# Patient Record
Sex: Female | Born: 1990 | Hispanic: No | Marital: Single | Smoking: Former smoker
Health system: Southern US, Community
[De-identification: ages and names within clinical notes are randomized; demographics above are authoritative.]

## PROBLEM LIST (undated history)

## (undated) DIAGNOSIS — F209 Schizophrenia, unspecified: Secondary | ICD-10-CM

## (undated) DIAGNOSIS — J45909 Unspecified asthma, uncomplicated: Secondary | ICD-10-CM

## (undated) DIAGNOSIS — F319 Bipolar disorder, unspecified: Secondary | ICD-10-CM

---

## 2012-12-04 ENCOUNTER — Encounter (HOSPITAL_COMMUNITY): Payer: Self-pay | Admitting: Emergency Medicine

## 2012-12-04 ENCOUNTER — Emergency Department (HOSPITAL_COMMUNITY)
Admission: EM | Admit: 2012-12-04 | Discharge: 2012-12-04 | Disposition: A | Discharge status: Home / Self Care | Payer: Medicaid Other | Source: Home / Self Care | Attending: Emergency Medicine | Admitting: Emergency Medicine

## 2012-12-04 DIAGNOSIS — H9190 Unspecified hearing loss, unspecified ear: Secondary | ICD-10-CM

## 2012-12-04 DIAGNOSIS — H9192 Unspecified hearing loss, left ear: Secondary | ICD-10-CM

## 2012-12-04 DIAGNOSIS — N739 Female pelvic inflammatory disease, unspecified: Secondary | ICD-10-CM

## 2012-12-04 HISTORY — DX: Bipolar disorder, unspecified: F31.9

## 2012-12-04 HISTORY — DX: Schizophrenia, unspecified: F20.9

## 2012-12-04 MED ORDER — DOXYCYCLINE HYCLATE 100 MG PO CAPS: 100.0000 mg | ORAL_CAPSULE | Freq: Two times a day (BID) | ORAL | Status: AC

## 2012-12-04 NOTE — ED Notes (Signed)
Multiple complaints: ear pain, left ear.  Minimal runny nose, no sore throat.  Hurting for 3-4 days.   Abscess on labia -noticed 2-3 days ago.

## 2012-12-04 NOTE — ED Provider Notes (Signed)
History     CSN: [id number]  Arrival date & time 12/04/12  1052   First MD Initiated Contact with Patient 12/04/12 1104      Chief Complaint  Patient presents with  . Otalgia    (Consider location/radiation/quality/duration/timing/severity/associated sxs/prior treatment) HPI Comments: Patient presents urgent care, with 2 distinctive concerns. She feels that she's not hearing well from her left ear for several days. Patient denies any ear drainage, pain, recent cold-like symptoms such as a runny nose, nasal congestion, cough or fevers. She denies any recent ear trauma.   Patient also describes a for the last several days she's been having infection on her external genitalia on the left side. His been draining and she busted a boil, she has had the same infection before. On several locations. Patient denies any fevers, pelvic pain, vaginal discharge, or vaginal bleeding.  Patient is a 22 y.o. female presenting with ear pain. The history is provided by the patient.  Otalgia Location:  Left Severity:  No pain Onset quality:  Gradual Timing:  Intermittent Progression:  Waxing and waning Context: not direct blow, not elevation change, not loud noise and no water in ear   Relieved by:  Nothing Ineffective treatments:  None tried Associated symptoms: hearing loss   Associated symptoms: no congestion, no cough, no neck pain, no rhinorrhea, no sore throat and no tinnitus   Risk factors: no recent travel, no chronic ear infection and no prior ear surgery     Past Medical History  Diagnosis Date  . Schizophrenia   . Bipolar 1 disorder     History reviewed. No pertinent past surgical history.  No family history on file.  History  Substance Use Topics  . Smoking status: Current Every Day Smoker  . Smokeless tobacco: Not on file  . Alcohol Use: No    OB History   Grav Para Term Preterm Abortions TAB SAB Ect Mult Living                  Review of Systems  Constitutional:  Negative for chills, diaphoresis, activity change and appetite change.  HENT: Positive for hearing loss. Negative for ear pain, nosebleeds, congestion, sore throat, facial swelling, rhinorrhea, sneezing, mouth sores, neck pain, neck stiffness, postnasal drip and tinnitus.   Respiratory: Negative for cough.   Cardiovascular: Negative for chest pain.  Genitourinary: Positive for genital sores. Negative for dysuria, urgency, frequency, flank pain, decreased urine volume, vaginal bleeding, vaginal discharge, difficulty urinating and pelvic pain.    Allergies  Review of patient's allergies indicates no known allergies.  Home Medications   Current Outpatient Rx  Name  Route  Sig  Dispense  Refill  . ARIPiprazole (ABILIFY) 5 MG tablet   Oral   Take 5 mg by mouth daily.         . benztropine (COGENTIN) 0.5 MG tablet   Oral   Take 0.5 mg by mouth 2 (two) times daily.         . folic acid (FOLVITE) 0.5 MG tablet   Oral   Take 1 mg by mouth daily.         . risperiDONE (RISPERDAL) 2 MG tablet   Oral   Take 2 mg by mouth 2 (two) times daily.         Marland Kitchen doxycycline (VIBRAMYCIN) 100 MG capsule   Oral   Take 1 capsule (100 mg total) by mouth 2 (two) times daily.   20 capsule   0  BP 95/64  Pulse 80  Temp(Src) 98.4 F (36.9 C) (Oral)  Resp 24  SpO2 97%  LMP 10/28/2012  Physical Exam  Vitals reviewed. Constitutional: She is oriented to person, place, and time. She appears well-developed and well-nourished.  HENT:  Head: Normocephalic and atraumatic.  Right Ear: External ear normal.  Left Ear: External ear normal.  Mouth/Throat: Oropharynx is clear and moist. No oropharyngeal exudate.  Eyes: Conjunctivae are normal. Pupils are equal, round, and reactive to light.  Neck: Normal range of motion. Neck supple. No JVD present.  Genitourinary:    No labial fusion. There is no rash, tenderness, lesion or injury on the right labia. There is tenderness on the left labia.  There is no rash or injury on the left labia. No erythema or bleeding around the vagina. No signs of injury around the vagina. No vaginal discharge found.  Lymphadenopathy:    She has no cervical adenopathy.  Neurological: She is alert and oriented to person, place, and time.  Skin: No rash noted. There is erythema. No pallor.    ED Course  Procedures (including critical care time)  Labs Reviewed  CULTURE, ROUTINE-ABSCESS   No results found.   1. Abscess of female genitalia   2. Hearing deficit, left       MDM  Problem #1 hearing deficit concern have refer patient to followup with an audiologist for a hearing test. Ear exam was unremarkable no signs of any external ear infection or tympanic membrane infection.  Problem #2 patient with a left labia majora localized abscess- no indication for incision and drainage this abscess was draining on its own. We obtained a sample for cultures and start patient on doxycycline.      Jimmie Molly, MD 12/04/12 [phone]

## 2012-12-07 LAB — CULTURE, ROUTINE-ABSCESS

## 2013-04-19 ENCOUNTER — Emergency Department (HOSPITAL_BASED_OUTPATIENT_CLINIC_OR_DEPARTMENT_OTHER): Payer: Medicaid Other

## 2013-04-19 ENCOUNTER — Emergency Department (HOSPITAL_BASED_OUTPATIENT_CLINIC_OR_DEPARTMENT_OTHER)
Admission: EM | Admit: 2013-04-19 | Discharge: 2013-04-19 | Disposition: A | Discharge status: Home / Self Care | Payer: Medicaid Other | Attending: Emergency Medicine | Admitting: Emergency Medicine

## 2013-04-19 ENCOUNTER — Encounter (HOSPITAL_BASED_OUTPATIENT_CLINIC_OR_DEPARTMENT_OTHER): Payer: Self-pay | Admitting: *Deleted

## 2013-04-19 DIAGNOSIS — F209 Schizophrenia, unspecified: Secondary | ICD-10-CM | POA: Insufficient documentation

## 2013-04-19 DIAGNOSIS — X500XXA Overexertion from strenuous movement or load, initial encounter: Secondary | ICD-10-CM | POA: Insufficient documentation

## 2013-04-19 DIAGNOSIS — S93409A Sprain of unspecified ligament of unspecified ankle, initial encounter: Secondary | ICD-10-CM | POA: Insufficient documentation

## 2013-04-19 DIAGNOSIS — Y9367 Activity, basketball: Secondary | ICD-10-CM | POA: Insufficient documentation

## 2013-04-19 DIAGNOSIS — Y9239 Other specified sports and athletic area as the place of occurrence of the external cause: Secondary | ICD-10-CM | POA: Insufficient documentation

## 2013-04-19 DIAGNOSIS — F319 Bipolar disorder, unspecified: Secondary | ICD-10-CM | POA: Insufficient documentation

## 2013-04-19 DIAGNOSIS — S93401A Sprain of unspecified ligament of right ankle, initial encounter: Secondary | ICD-10-CM

## 2013-04-19 DIAGNOSIS — Y92838 Other recreation area as the place of occurrence of the external cause: Secondary | ICD-10-CM | POA: Insufficient documentation

## 2013-04-19 DIAGNOSIS — F17200 Nicotine dependence, unspecified, uncomplicated: Secondary | ICD-10-CM | POA: Insufficient documentation

## 2013-04-19 IMAGING — CR DG ANKLE COMPLETE 3+V*R*
3 series · 3 of 3 positions shown · non-contrast
Comparison: None.

CLINICAL DATA: Lateral ankle pain secondary to a twisting injury
today.

RIGHT ANKLE - COMPLETE 3+ VIEW

[t ankle joint ap right]
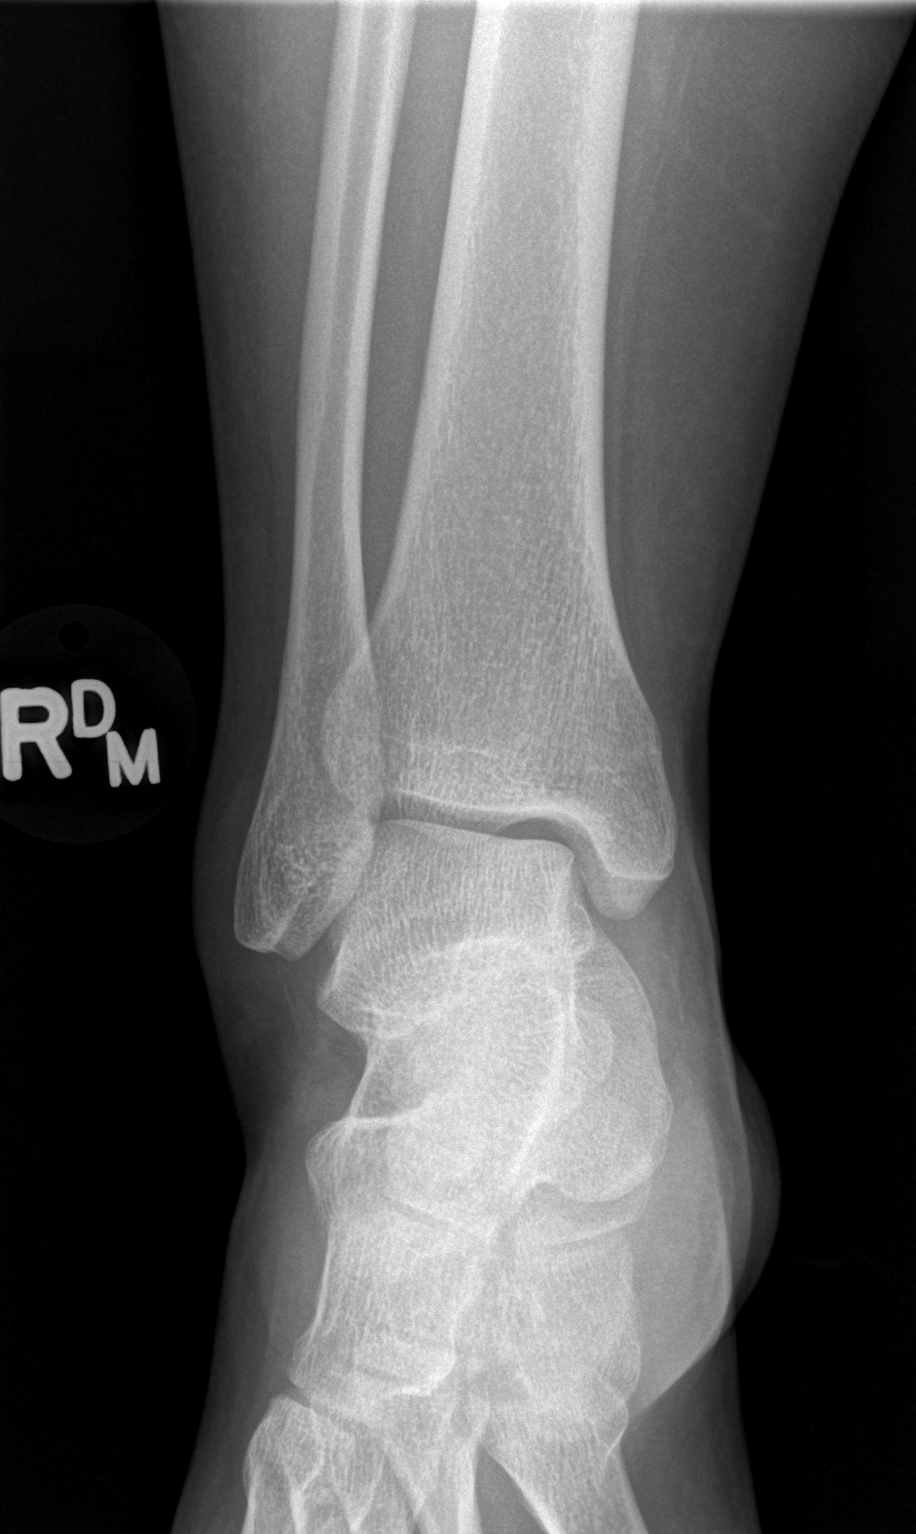

[t ankle joint oblique right]
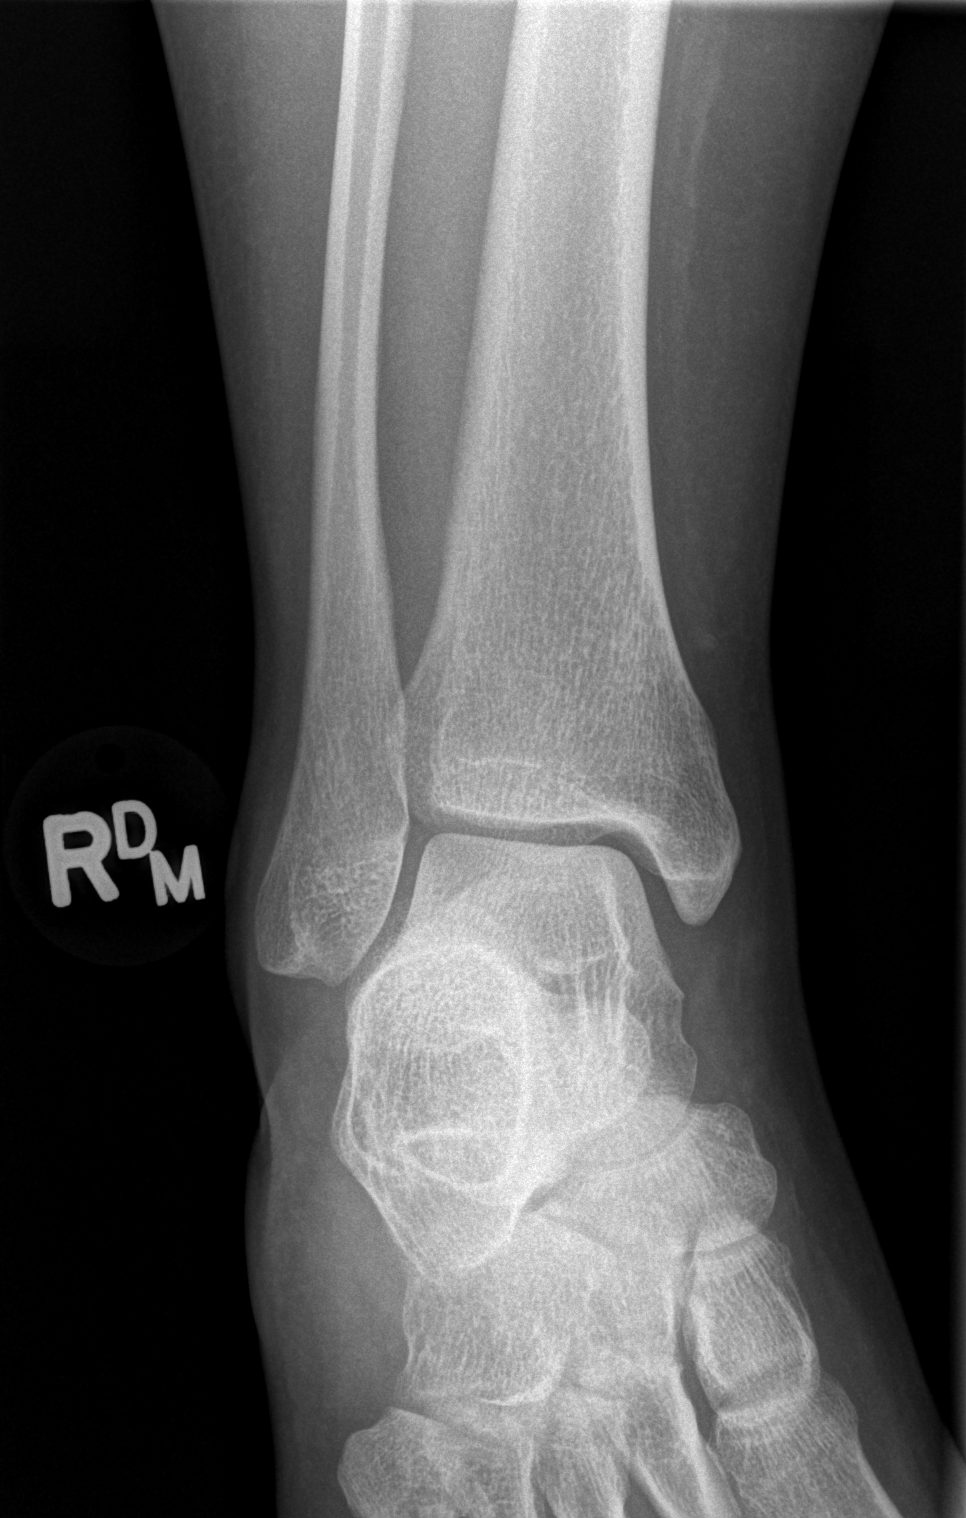

[t ankle joint lat right]
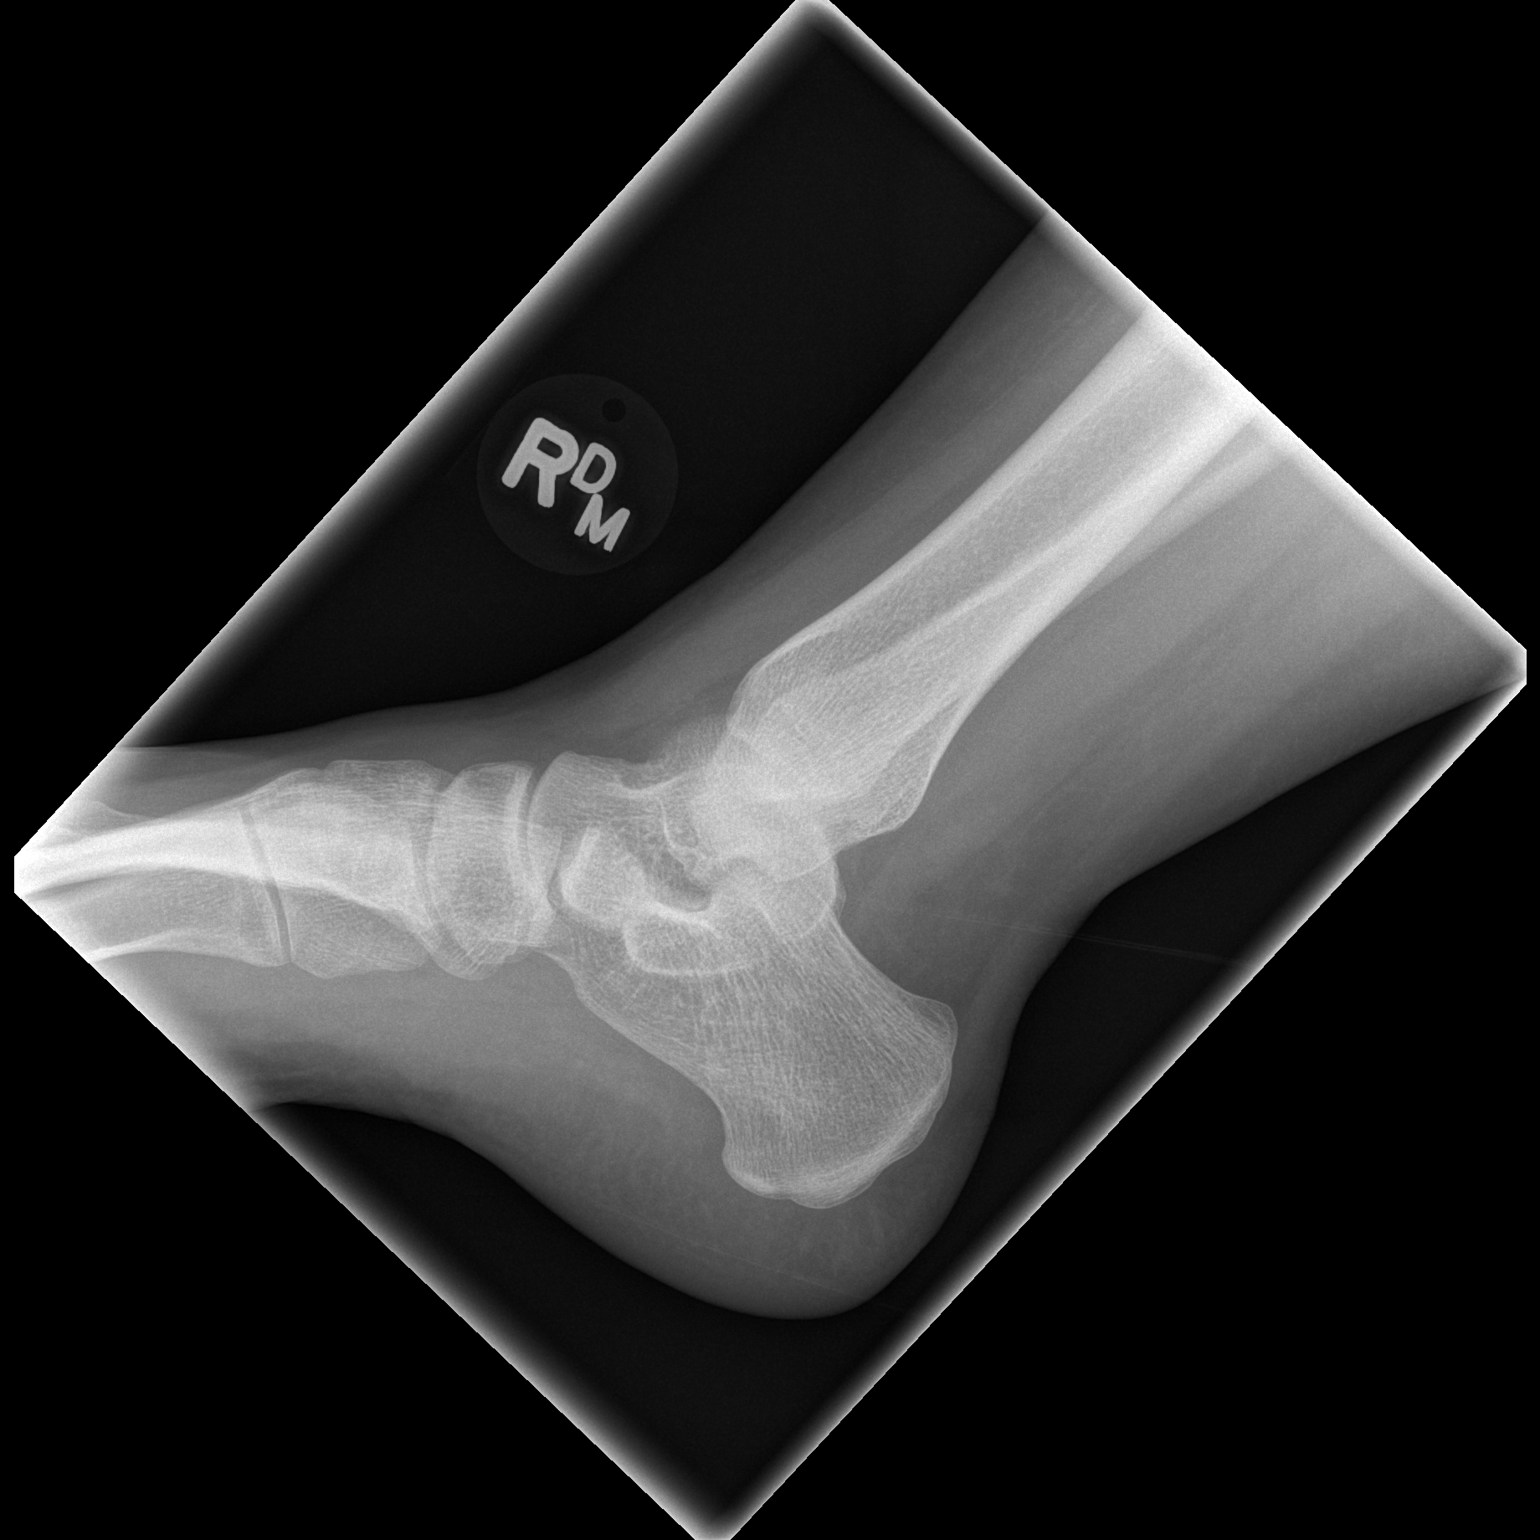

[3 of 3 positions shown; findings below may reference images not displayed]

FINDINGS: There is no fracture, dislocation, or joint effusion.
IMPRESSION: Normal exam.

## 2013-04-19 MED ORDER — HYDROCODONE-ACETAMINOPHEN 5-325 MG PO TABS: 1.0000 | ORAL_TABLET | Freq: Four times a day (QID) | ORAL | Status: DC | PRN

## 2013-04-19 NOTE — ED Notes (Signed)
Pt states she injured her right ankle playing bball today. PMS intact. Ice applied at triage.

## 2013-04-19 NOTE — ED Notes (Signed)
I placed an ASO on the patient's right ankle. There isn't placed in the charge capture under the supplies to charge for the ASO.

## 2013-04-19 NOTE — ED Provider Notes (Signed)
History    CSN: [id number] Arrival date & time 04/19/13  1255  First MD Initiated Contact with Patient 04/19/13 1326     Chief Complaint  Patient presents with  . Ankle Injury   (Consider location/radiation/quality/duration/timing/severity/associated sxs/prior Treatment) Patient is a 22 y.o. female presenting with ankle pain. The history is provided by the patient.  Ankle Pain Location:  Ankle Time since incident:  1 hour Injury: yes   Mechanism of injury comment:  Basketball Ankle location:  R ankle Pain details:    Quality:  Aching   Radiates to:  Does not radiate   Severity:  Moderate   Onset quality:  Sudden   Timing:  Constant   Progression:  Improving Chronicity:  New Dislocation: no   Foreign body present:  No foreign bodies Prior injury to area:  No Relieved by:  None tried Worsened by:  Bearing weight Ineffective treatments:  None tried Associated symptoms: swelling   Associated symptoms: no decreased ROM, no fever, no muscle weakness, no neck pain, no numbness, no stiffness and no tingling   Risk factors: obesity   Risk factors: no concern for non-accidental trauma, no frequent fractures, no known bone disorder and no recent illness    Lindsay Perry is a 22 y.o. female who presents to the ED with right ankle pain. The pain started suddenly approximately 15 minutes prior to arrival to the ED. She was playing basketball and rolled her ankle inward. She complained of pain and swelling immediately after the injury.  Ice applied at triage. She denies any other injuries.   Past Medical History  Diagnosis Date  . Schizophrenia   . Bipolar 1 disorder    History reviewed. No pertinent past surgical history. History reviewed. No pertinent family history. History  Substance Use Topics  . Smoking status: Current Every Day Smoker  . Smokeless tobacco: Not on file  . Alcohol Use: No   OB History   Grav Para Term Preterm Abortions TAB SAB Ect Mult Living        Review of Systems  Constitutional: Negative for fever.  HENT: Negative for neck pain.   Respiratory: Negative for shortness of breath.   Cardiovascular: Negative for chest pain.  Musculoskeletal: Negative for stiffness.       Right ankle pain  Skin: Negative for wound.  Neurological: Negative for dizziness and headaches.  Psychiatric/Behavioral: The patient is not nervous/anxious.     Allergies  Review of patient's allergies indicates no known allergies.  Home Medications   Current Outpatient Rx  Name  Route  Sig  Dispense  Refill  . ARIPiprazole (ABILIFY) 5 MG tablet   Oral   Take 5 mg by mouth daily.         . benztropine (COGENTIN) 0.5 MG tablet   Oral   Take 0.5 mg by mouth 2 (two) times daily.         . folic acid (FOLVITE) 0.5 MG tablet   Oral   Take 1 mg by mouth daily.         . risperiDONE (RISPERDAL) 2 MG tablet   Oral   Take 2 mg by mouth 2 (two) times daily.          BP 101/60  Pulse 90  Temp(Src) 98.3 F (36.8 C) (Oral)  Resp 18  Ht 5\' 3"  (1.6 m)  Wt 210 lb (95.255 kg)  BMI 37.21 kg/m2  SpO2 97%  LMP 03/25/2013 Physical Exam  Nursing note and vitals reviewed. Constitutional:  She is oriented to person, place, and time. She appears well-developed and well-nourished. No distress.  HENT:  Head: Normocephalic.  Eyes: EOM are normal.  Neck: Neck supple.  Cardiovascular: Normal rate.   Pulmonary/Chest: Effort normal.  Abdominal: Soft. There is no tenderness.  Musculoskeletal:       Right ankle: She exhibits swelling (lateral aspect). She exhibits normal range of motion, no deformity, no laceration and normal pulse. Tenderness. Lateral malleolus tenderness found. Achilles tendon normal.  Neurological: She is alert and oriented to person, place, and time. No cranial nerve deficit.  Skin: Skin is warm and dry.  Psychiatric: She has a normal mood and affect. Her behavior is normal.   Dg Ankle Complete Right  04/19/2013   *RADIOLOGY  REPORT*  Clinical Data: Lateral ankle pain secondary to a twisting injury today.  RIGHT ANKLE - COMPLETE 3+ VIEW  Comparison: None.  Findings: There is no fracture, dislocation, or joint effusion.  IMPRESSION: Normal exam.   Original Report Authenticated By: Francene Boyers, M.D.    ED Course  Procedures  MDM  22 y.o. female with right ankle sprain. Will apply ASO and she will use crutches for ambulation. She will apply ice and we will treat pain. She will take ibuprofen in addition to the pain medication. Patient stable for discharge at this time without immediate concern for compartment syndrome.   I have reviewed this patient's vital signs, nurses notes, appropriate imaging and discussed findings and plan of care with the patient. She will wear the ASO as needed for comfort.    Medication List    TAKE these medications       HYDROcodone-acetaminophen 5-325 MG per tablet  Commonly known as:  NORCO/VICODIN  Take 1 tablet by mouth every 6 (six) hours as needed for pain.      ASK your doctor about these medications       ARIPiprazole 5 MG tablet  Commonly known as:  ABILIFY  Take 5 mg by mouth daily.     benztropine 0.5 MG tablet  Commonly known as:  COGENTIN  Take 0.5 mg by mouth 2 (two) times daily.     folic acid 0.5 MG tablet  Commonly known as:  FOLVITE  Take 1 mg by mouth daily.     risperiDONE 2 MG tablet  Commonly known as:  RISPERDAL  Take 2 mg by mouth 2 (two) times daily.           [facility] Orlene Och, [place] 04/19/13 1401

## 2013-04-19 NOTE — ED Provider Notes (Signed)
Medical screening examination/treatment/procedure(s) were performed by non-physician practitioner and as supervising physician I was immediately available for consultation/collaboration.    Nelia Shi, MD 04/19/13 [phone]

## 2013-06-08 ENCOUNTER — Emergency Department (HOSPITAL_COMMUNITY)
Admission: EM | Admit: 2013-06-08 | Discharge: 2013-06-08 | Disposition: A | Discharge status: Home / Self Care | Payer: Medicaid Other | Attending: Emergency Medicine | Admitting: Emergency Medicine

## 2013-06-08 ENCOUNTER — Encounter (HOSPITAL_COMMUNITY): Payer: Self-pay | Admitting: Emergency Medicine

## 2013-06-08 DIAGNOSIS — M549 Dorsalgia, unspecified: Secondary | ICD-10-CM | POA: Insufficient documentation

## 2013-06-08 DIAGNOSIS — F209 Schizophrenia, unspecified: Secondary | ICD-10-CM | POA: Insufficient documentation

## 2013-06-08 DIAGNOSIS — Y9389 Activity, other specified: Secondary | ICD-10-CM | POA: Insufficient documentation

## 2013-06-08 DIAGNOSIS — Y92410 Unspecified street and highway as the place of occurrence of the external cause: Secondary | ICD-10-CM | POA: Insufficient documentation

## 2013-06-08 DIAGNOSIS — F411 Generalized anxiety disorder: Secondary | ICD-10-CM | POA: Insufficient documentation

## 2013-06-08 DIAGNOSIS — F319 Bipolar disorder, unspecified: Secondary | ICD-10-CM | POA: Insufficient documentation

## 2013-06-08 DIAGNOSIS — F17200 Nicotine dependence, unspecified, uncomplicated: Secondary | ICD-10-CM | POA: Insufficient documentation

## 2013-06-08 DIAGNOSIS — Z79899 Other long term (current) drug therapy: Secondary | ICD-10-CM | POA: Insufficient documentation

## 2013-06-08 MED ORDER — IBUPROFEN 600 MG PO TABS: 600.0000 mg | ORAL_TABLET | Freq: Four times a day (QID) | ORAL | Status: DC | PRN
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
## 2013-06-08 NOTE — ED Provider Notes (Signed)
Medical screening examination/treatment/procedure(s) were performed by non-physician practitioner and as supervising physician I was immediately available for consultation/collaboration.   Loren Racer, MD 06/08/13 [phone]
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
## 2013-08-24 ENCOUNTER — Emergency Department (HOSPITAL_COMMUNITY)
Admission: EM | Admit: 2013-08-24 | Discharge: 2013-08-24 | Disposition: A | Discharge status: Home / Self Care | Payer: Medicaid Other | Attending: Emergency Medicine | Admitting: Emergency Medicine

## 2013-08-24 ENCOUNTER — Encounter (HOSPITAL_COMMUNITY): Payer: Self-pay | Admitting: Emergency Medicine

## 2013-08-24 DIAGNOSIS — F319 Bipolar disorder, unspecified: Secondary | ICD-10-CM | POA: Insufficient documentation

## 2013-08-24 DIAGNOSIS — F411 Generalized anxiety disorder: Secondary | ICD-10-CM | POA: Insufficient documentation

## 2013-08-24 DIAGNOSIS — F17200 Nicotine dependence, unspecified, uncomplicated: Secondary | ICD-10-CM | POA: Insufficient documentation

## 2013-08-24 DIAGNOSIS — Z3202 Encounter for pregnancy test, result negative: Secondary | ICD-10-CM | POA: Insufficient documentation

## 2013-08-24 DIAGNOSIS — Z79899 Other long term (current) drug therapy: Secondary | ICD-10-CM | POA: Insufficient documentation

## 2013-08-24 DIAGNOSIS — F209 Schizophrenia, unspecified: Secondary | ICD-10-CM | POA: Insufficient documentation

## 2013-08-24 DIAGNOSIS — R451 Restlessness and agitation: Secondary | ICD-10-CM

## 2013-08-24 DIAGNOSIS — IMO0002 Reserved for concepts with insufficient information to code with codable children: Secondary | ICD-10-CM | POA: Insufficient documentation

## 2013-08-24 LAB — CBC
HCT: 45.4 % (ref 36.0–46.0)
MCV: 90.8 fL (ref 78.0–100.0)
RBC: 5 MIL/uL (ref 3.87–5.11)
RDW: 12.4 % (ref 11.5–15.5)
WBC: 10.2 10*3/uL (ref 4.0–10.5)

## 2013-08-24 LAB — COMPREHENSIVE METABOLIC PANEL
AST: 18 U/L (ref 0–37)
Albumin: 4 g/dL (ref 3.5–5.2)
Alkaline Phosphatase: 69 U/L (ref 39–117)
BUN: 7 mg/dL (ref 6–23)
CO2: 25 mEq/L (ref 19–32)
Chloride: 98 mEq/L (ref 96–112)
GFR calc non Af Amer: >90 mL/min (ref >90)
Potassium: 3.7 mEq/L (ref 3.5–5.1)
Total Bilirubin: 0.2 mg/dL — ABNORMAL LOW (ref 0.3–1.2)

## 2013-08-24 LAB — POCT PREGNANCY, URINE: Preg Test, Ur: NEGATIVE

## 2013-08-24 LAB — ETHANOL: Alcohol, Ethyl (B): <11 mg/dL (ref 0–11)

## 2013-08-24 LAB — RAPID URINE DRUG SCREEN, HOSP PERFORMED
Amphetamines: NOT DETECTED
Barbiturates: NOT DETECTED
Benzodiazepines: NOT DETECTED

## 2013-08-24 LAB — ACETAMINOPHEN LEVEL: Acetaminophen (Tylenol), Serum: <15 ug/mL (ref 10–30)

## 2013-08-24 NOTE — ED Notes (Signed)
Pt states she has been having angry thoughts and gets angry all the time  Pt states she does not think her medication is working correctly  Pt states she is currently living in a group home and does not want to live there anymore

## 2013-08-24 NOTE — ED Notes (Signed)
Pt denies SI/HI 

## 2013-08-24 NOTE — ED Provider Notes (Signed)
CSN: [id number]     Arrival date & time 08/24/13  1907 History   First MD Initiated Contact with Patient 08/24/13 2029    This chart was scribed for Lindsay Madura PA-C, a non-physician practitioner working with No att. providers found by Lewanda Rife, ED Scribe. This patient was seen in room WA18/WA18 and the patient's care was started at 4:58 AM    Chief Complaint  Patient presents with  . Medical Clearance   (Consider location/radiation/quality/duration/timing/severity/associated sxs/prior Treatment) The history is provided by the patient. No language interpreter was used.   HPI Comments: Lindsay Perry is a 22 y.o. female who presents to the Emergency Department complaining of worsening angry thoughts onset "all the time, but more recently". Denies any precipitating factors. Reports she feels her medications are not helping. Reports she currently takes cogentin, risperdal, and abilify as prescribed. Denies associated SI, HI, auditory hallucinations, and visual hallucinations. Denies alcohol abuse and illicit drug use.  Reports she has a f/u appointment with psychiatrist tomorrow. States she feels safe in her group home.   Past Medical History  Diagnosis Date  . Schizophrenia   . Bipolar 1 disorder    History reviewed. No pertinent past surgical history. History reviewed. No pertinent family history. History  Substance Use Topics  . Smoking status: Current Every Day Smoker    Types: Cigarettes  . Smokeless tobacco: Not on file  . Alcohol Use: No   OB History   Grav Para Term Preterm Abortions TAB SAB Ect Mult Living                 Review of Systems  Psychiatric/Behavioral: Positive for agitation. Negative for suicidal ideas, hallucinations and self-injury.  All other systems reviewed and are negative.  A complete 10 system review of systems was obtained and all systems are negative except as noted in the HPI and PMHx.      Allergies  Review of patient's allergies  indicates no known allergies.  Home Medications   Current Outpatient Rx  Name  Route  Sig  Dispense  Refill  . acetaminophen (TYLENOL) 500 MG tablet   Oral   Take 500 mg by mouth every 6 (six) hours as needed for pain.         . ARIPiprazole (ABILIFY) 5 MG tablet   Oral   Take 5 mg by mouth daily.         . benztropine (COGENTIN) 0.5 MG tablet   Oral   Take 0.5 mg by mouth 2 (two) times daily.         . folic acid (FOLVITE) 0.5 MG tablet   Oral   Take 1 mg by mouth daily.         . risperiDONE (RISPERDAL) 2 MG tablet   Oral   Take 2 mg by mouth daily.           BP 108/66  Pulse 76  Temp(Src) 98 F (36.7 C) (Oral)  Resp 16  Ht 5\' 6"  (1.676 m)  Wt 225 lb 6 oz (102.229 kg)  BMI 36.39 kg/m2  SpO2 96%  LMP 08/16/2013 Physical Exam  Nursing note and vitals reviewed. Constitutional: She is oriented to person, place, and time. She appears well-developed and well-nourished. No distress.  HENT:  Head: Normocephalic and atraumatic.  Mouth/Throat: No oropharyngeal exudate.  Eyes: Conjunctivae and EOM are normal. Pupils are equal, round, and reactive to light. No scleral icterus.  Neck: Normal range of motion. Neck supple.  Cardiovascular: Normal rate,  regular rhythm, normal heart sounds and intact distal pulses.   No murmur heard. Pulses:      Radial pulses are 2+ on the right side, and 2+ on the left side.  Pulmonary/Chest: Effort normal and breath sounds normal. No respiratory distress. She has no wheezes. She has no rales.  Abdominal: Soft. She exhibits no distension. There is no tenderness. There is no rebound and no guarding.  Musculoskeletal: Normal range of motion.  Neurological: She is alert and oriented to person, place, and time. GCS eye subscore is 4. GCS verbal subscore is 5. GCS motor subscore is 6.  Skin: Skin is warm and dry. No rash noted. She is not diaphoretic. No erythema. No pallor.  Psychiatric: Judgment normal. Her mood appears anxious. Her  speech is delayed. She is withdrawn. Cognition and memory are normal. She expresses no homicidal and no suicidal ideation. She expresses no suicidal plans and no homicidal plans.    ED Course  Procedures (including critical care time)  COORDINATION OF CARE:  Nursing notes reviewed. Vital signs reviewed. Initial pt interview and examination performed.   4:58 AM-Discussed work up plan with pt at bedside, which includes acetaminophen level, salicylate level, POCT urine pregnancy, UDS, CMP and CBC. Pt agrees with plan.   Treatment plan initiated:Medications - No data to display   Initial diagnostic testing ordered.    Labs Review Labs Reviewed  CBC - Abnormal; Notable for the following:    Hemoglobin 16.1 (*)    All other components within normal limits  COMPREHENSIVE METABOLIC PANEL - Abnormal; Notable for the following:    Sodium 133 (*)    Total Bilirubin 0.2 (*)    All other components within normal limits  SALICYLATE LEVEL - Abnormal; Notable for the following:    Salicylate Lvl <2.0 (*)    All other components within normal limits  ACETAMINOPHEN LEVEL  ETHANOL  URINE RAPID DRUG SCREEN (HOSP PERFORMED)  POCT PREGNANCY, URINE   Imaging Review No results found.  EKG Interpretation   None       MDM   1. Agitation    Patient presents for increased agitation secondary to "angry thoughts". Patient associates these thoughts with changes in her current medication regimen. Patient is followed by a psychiatrist for her schizophrenia and bipolar disorder. She endorses followup with her psychiatrist tomorrow to discuss these recent changes to her medications. Patient denies SI/HI assaults alcohol and illicit drug use. Physical exam unremarkable and labs today without any significant findings. Patient feels safe at her group home and she is able to contract for safety with me today. When asked why she chose to present to the emergency department today, given her scheduled followup  tomorrow, patient responds that "she did not realize she had her appointment until after she had already arrived to the ED". Believe patient is stable and appropriate for d/c with outpatient follow up tomorrow with psychiatrist. Return precautions discussed and patient agreeable to plan with no unaddressed concerns.  I personally performed the services described in this documentation, which was scribed in my presence. The recorded information has been reviewed and is accurate.     Lindsay Madura, PA-C 08/25/13 [phone]

## 2013-08-24 NOTE — ED Notes (Signed)
Spoke to patient's caregiver, she states it will be @ 45 minutes for arrival to pick up patient.

## 2013-08-24 NOTE — ED Notes (Addendum)
Pt. And belongings searched and wanded by security. Pt. Has 1 belongings bag and brown duffle bag. Pt. Has bra, shoes, hair band, 1 earring,socks, glasses (pt. Has glasses on her) sweat pant, silver necklace, shirt and underwear. Pt belongings locked up at the nurses station in triage.

## 2013-08-26 NOTE — ED Provider Notes (Signed)
.  attu Medical screening examination/treatment/procedure(s) were performed by non-physician practitioner and as supervising physician I was immediately available for consultation/collaboration.  EKG Interpretation   None         Benny Lennert, MD 08/26/13 [phone]

## 2015-06-29 ENCOUNTER — Emergency Department (INDEPENDENT_AMBULATORY_CARE_PROVIDER_SITE_OTHER)
Admission: EM | Admit: 2015-06-29 | Discharge: 2015-06-29 | Disposition: A | Discharge status: Home / Self Care | Payer: Medicaid Other | Source: Home / Self Care | Attending: Family Medicine | Admitting: Family Medicine

## 2015-06-29 ENCOUNTER — Encounter (HOSPITAL_COMMUNITY): Payer: Self-pay | Admitting: Emergency Medicine

## 2015-06-29 DIAGNOSIS — L03116 Cellulitis of left lower limb: Secondary | ICD-10-CM | POA: Diagnosis not present

## 2015-06-29 HISTORY — DX: Unspecified asthma, uncomplicated: J45.909

## 2015-06-29 MED ORDER — MUPIROCIN 2 % EX OINT: TOPICAL_OINTMENT | CUTANEOUS | Status: DC

## 2015-06-29 NOTE — ED Provider Notes (Signed)
CSN: [id number]     Arrival date & time 06/29/15  1303 History   First MD Initiated Contact with Patient 06/29/15 1324     Chief Complaint  Patient presents with  . Abscess  . Cough   (Consider location/radiation/quality/duration/timing/severity/associated sxs/prior Treatment) Patient is a 24 y.o. female presenting with abscess. The history is provided by the patient.  Abscess Location:  Leg Leg abscess location:  L upper leg Abscess quality: draining, painful and redness   Red streaking: no   Progression:  Improving Pain details:    Severity:  Mild Chronicity:  New Relieved by:  None tried Worsened by:  Nothing tried Ineffective treatments:  None tried Associated symptoms: no fever   Risk factors: prior abscess     Past Medical History  Diagnosis Date  . Schizophrenia   . Bipolar 1 disorder   . Asthma    History reviewed. No pertinent past surgical history. History reviewed. No pertinent family history. Social History  Substance Use Topics  . Smoking status: Current Every Day Smoker    Types: Cigarettes  . Smokeless tobacco: None  . Alcohol Use: No   OB History    No data available     Review of Systems  Constitutional: Negative.  Negative for fever.  Musculoskeletal: Negative.   Skin: Positive for wound.  All other systems reviewed and are negative.   Allergies  Review of patient's allergies indicates no known allergies.  Home Medications   Prior to Admission medications   Medication Sig Start Date End Date Taking? Authorizing Provider  acetaminophen (TYLENOL) 500 MG tablet Take 500 mg by mouth every 6 (six) hours as needed for pain.    Historical Provider, MD  ARIPiprazole (ABILIFY) 5 MG tablet Take 5 mg by mouth daily.    Historical Provider, MD  benztropine (COGENTIN) 0.5 MG tablet Take 0.5 mg by mouth 2 (two) times daily.    Historical Provider, MD  folic acid (FOLVITE) 0.5 MG tablet Take 1 mg by mouth daily.    Historical Provider, MD  mupirocin  ointment (BACTROBAN) 2 % Apply to skin bid 06/29/15   Linna Hoff, MD  risperiDONE (RISPERDAL) 2 MG tablet Take 2 mg by mouth daily.     Historical Provider, MD   Meds Ordered and Administered this Visit  Medications - No data to display  BP 108/70 mmHg  Pulse 103  Temp(Src) 97.9 F (36.6 C) (Oral)  Resp 28  SpO2 96%  LMP 06/28/2015 No data found.   Physical Exam  Constitutional: She is oriented to person, place, and time. She appears well-developed and well-nourished.  Neurological: She is alert and oriented to person, place, and time.  Skin: Skin is warm and dry. Rash noted.  Local lesion, resolving to left medial thigh, sl erythema, no drainage.  Nursing note and vitals reviewed.   ED Course  Procedures (including critical care time)  Labs Review Labs Reviewed - No data to display  Imaging Review No results found.   Visual Acuity Review  Right Eye Distance:   Left Eye Distance:   Bilateral Distance:    Right Eye Near:   Left Eye Near:    Bilateral Near:         MDM   1. Cellulitis of leg, left        Linna Hoff, MD 06/29/15 1342

## 2015-06-29 NOTE — ED Notes (Signed)
At bedside with dr Artis Flock for exam of left inner thigh, healing wound present where patient reports abscess to be.  Patient says wound "busted last night"

## 2015-06-29 NOTE — Discharge Instructions (Signed)
Warm cloth and antibiotic medicine 2-3 times a day.

## 2015-06-29 NOTE — ED Notes (Signed)
Patient has multiple complaints.  Abscess to left inner thigh.  Reports this abscess has ruptured.    History of the same, has had antibiotics in the past for abscess.  Patient reports boil on 9/10.  Patient also has a cough and headache.  Cough onset 9/13.  Headache on 9/14.

## 2019-10-16 NOTE — L&D Delivery Note (Addendum)
OB/GYN Faculty Practice Delivery Note  Lindsay Perry is a 29 y.o. G1P0 s/p SVD at [redacted]w[redacted]d. She was admitted for PPROM.   ROM: 13h 78m with clear fluid GBS Status: Negative PCR, but received penicillin due to prematurity Maximum Maternal Temperature: 98.3  Labor Progress: . Given 1 dose of Cytotec. Then a Foley bulb was placed.  Started on pitocin.  Progressed to complete without complication.  Noted to have meconium with pushing.  Delivery Date/Time: 03/31/2020 at 1813 Delivery: Called to room and patient was complete and pushing. Head delivered ROA. No nuchal cord present. Shoulder and body delivered in usual fashion. Infant with spontaneous cry.  Cord was immediately clamped x2 and cut.  Infant was handed over to NICU team.  Cord gas was collected and cord blood drawn.  Placenta Placenta delivered spontaneously with gentle cord traction. Fundus firm with massage and Pitocin. Labia, perineum, vagina, and cervix were inspected, found to have a 2nd degree perineal laceration and right labial laceration.  Both of the lacerations were repaired with 3-0 Vicryl in the usual fashion by Dr. Salomon Mast with hemostasis noted.   Post placental IUD placed by Dr. Salomon Mast- see separate procedure note for details.  Placenta: Intact- small 2 x 5cm area of indentation with dark blood suspicious for placental abruption, 3 vessel cord, to pathology Complications: None Lacerations: 2nd degree perineal, right labial- repaired with Vicryl in the usual fashion. EBL: 150 cc Analgesia: IV pain meds  Postpartum Planning [x]  message to sent to schedule follow-up  [x]  vaccines UTD  Infant: Viable female  APGARs 9/9  2565 g (5 lb 10.5 oz)   EMILY , MD PGY-2 Resident, Family Medicine 03/31/2020, 7:08 PM  GME ATTESTATION:  I saw and evaluated the patient. I was gloved and supervised the resident throughout the entire delivery. I personally repaired the right labial tear and the 2nd degree laceration. I  placed a Liletta IUD post-placentally- please see separate procedure note for details. I agree with the findings and the plan of care as documented in the resident's note.  Genene Churn, DO OB Fellow, Faculty [facility], Center for [facility] 03/31/2020 7:28 PM

## 2020-01-12 ENCOUNTER — Encounter (HOSPITAL_COMMUNITY): Payer: Self-pay | Admitting: Emergency Medicine

## 2020-01-12 ENCOUNTER — Emergency Department (HOSPITAL_COMMUNITY)
Admission: EM | Admit: 2020-01-12 | Discharge: 2020-01-12 | Discharge status: Against Medical Advice | Payer: Medicaid Other | Attending: Emergency Medicine | Admitting: Emergency Medicine

## 2020-01-12 ENCOUNTER — Other Ambulatory Visit: Payer: Self-pay

## 2020-01-12 DIAGNOSIS — R5383 Other fatigue: Secondary | ICD-10-CM | POA: Insufficient documentation

## 2020-01-12 DIAGNOSIS — Z5321 Procedure and treatment not carried out due to patient leaving prior to being seen by health care provider: Secondary | ICD-10-CM | POA: Insufficient documentation

## 2020-01-12 LAB — RAPID URINE DRUG SCREEN, HOSP PERFORMED
Amphetamines: NOT DETECTED
Barbiturates: NOT DETECTED
Benzodiazepines: NOT DETECTED
Cocaine: POSITIVE — AB
Opiates: NOT DETECTED
Tetrahydrocannabinol: NOT DETECTED

## 2020-01-12 LAB — CBC WITH DIFFERENTIAL/PLATELET
Abs Immature Granulocytes: 0.05 10*3/uL (ref 0.00–0.07)
Basophils Absolute: 0 10*3/uL (ref 0.0–0.1)
Basophils Relative: 0 %
Eosinophils Absolute: 0.1 10*3/uL (ref 0.0–0.5)
Eosinophils Relative: 1 %
HCT: 36 % (ref 36.0–46.0)
Hemoglobin: 12.4 g/dL (ref 12.0–15.0)
Immature Granulocytes: 1 %
Lymphocytes Relative: 28 %
Lymphs Abs: 3 10*3/uL (ref 0.7–4.0)
MCH: 31.9 pg (ref 26.0–34.0)
MCHC: 34.4 g/dL (ref 30.0–36.0)
MCV: 92.5 fL (ref 80.0–100.0)
Monocytes Absolute: 0.6 10*3/uL (ref 0.1–1.0)
Monocytes Relative: 6 %
Neutro Abs: 7 10*3/uL (ref 1.7–7.7)
Neutrophils Relative %: 64 %
Platelets: 273 10*3/uL (ref 150–400)
RBC: 3.89 MIL/uL (ref 3.87–5.11)
RDW: 13 % (ref 11.5–15.5)
WBC: 10.7 10*3/uL — ABNORMAL HIGH (ref 4.0–10.5)
nRBC: 0 % (ref 0.0–0.2)

## 2020-01-12 LAB — BASIC METABOLIC PANEL
Anion gap: 11 (ref 5–15)
BUN: <5 mg/dL — ABNORMAL LOW (ref 6–20)
CO2: 21 mmol/L — ABNORMAL LOW (ref 22–32)
Calcium: 9.5 mg/dL (ref 8.9–10.3)
Chloride: 104 mmol/L (ref 98–111)
Creatinine, Ser: 0.52 mg/dL (ref 0.44–1.00)
GFR calc Af Amer: >60 mL/min (ref >60)
GFR calc non Af Amer: >60 mL/min (ref >60)
Glucose, Bld: 111 mg/dL — ABNORMAL HIGH (ref 70–99)
Potassium: 3.5 mmol/L (ref 3.5–5.1)
Sodium: 136 mmol/L (ref 135–145)

## 2020-01-12 LAB — ETHANOL: Alcohol, Ethyl (B): <10 mg/dL (ref <10)

## 2020-01-12 LAB — I-STAT BETA HCG BLOOD, ED (MC, WL, AP ONLY): I-stat hCG, quantitative: >2000 m[IU]/mL — ABNORMAL HIGH (ref <5)

## 2020-01-12 NOTE — ED Triage Notes (Signed)
Patient reports feeling tired /fatigue and lethargic after smoking Cocaine last night , alert and oriented x3 ( doesn't know year) , denies suicidal ideation , respirations unlabored /denies pain .

## 2020-01-12 NOTE — ED Notes (Signed)
Pt stated she felt better and wanted to leave. This tech took out the pt's iv.

## 2020-02-22 ENCOUNTER — Encounter: Payer: Self-pay | Admitting: Family Medicine

## 2020-03-01 ENCOUNTER — Telehealth: Payer: Self-pay | Admitting: Women's Health

## 2020-03-01 NOTE — Telephone Encounter (Signed)
Attempted to reach patient about getting scheduled for prenatal care. Was not able to leave a voicemail message.

## 2020-03-04 ENCOUNTER — Encounter: Payer: Self-pay | Admitting: Family Medicine

## 2020-03-10 ENCOUNTER — Other Ambulatory Visit: Payer: Self-pay

## 2020-03-10 DIAGNOSIS — Z3490 Encounter for supervision of normal pregnancy, unspecified, unspecified trimester: Secondary | ICD-10-CM

## 2020-03-10 NOTE — Progress Notes (Signed)
Pt's mother called front office staff to request new OB appt. Pregnancy confirmation received. Pt cannot recall LMP; [place] ordered for uncertain dates. Front office notified to schedule [place] with nurse visit to follow for results. Pt will be scheduled for new OB appts.

## 2020-03-18 ENCOUNTER — Other Ambulatory Visit (HOSPITAL_COMMUNITY): Payer: Self-pay | Admitting: Obstetrics & Gynecology

## 2020-03-18 ENCOUNTER — Ambulatory Visit
Admission: RE | Admit: 2020-03-18 | Discharge: 2020-03-18 | Disposition: A | Discharge status: Home / Self Care | Payer: Medicaid Other | Source: Ambulatory Visit | Attending: Obstetrics & Gynecology | Admitting: Obstetrics & Gynecology

## 2020-03-18 ENCOUNTER — Other Ambulatory Visit: Payer: Self-pay | Admitting: *Deleted

## 2020-03-18 ENCOUNTER — Other Ambulatory Visit: Payer: Self-pay

## 2020-03-18 ENCOUNTER — Ambulatory Visit (HOSPITAL_BASED_OUTPATIENT_CLINIC_OR_DEPARTMENT_OTHER): Payer: Medicaid Other

## 2020-03-18 ENCOUNTER — Ambulatory Visit: Payer: Medicaid Other

## 2020-03-18 ENCOUNTER — Ambulatory Visit (INDEPENDENT_AMBULATORY_CARE_PROVIDER_SITE_OTHER): Payer: Medicaid Other | Admitting: *Deleted

## 2020-03-18 DIAGNOSIS — Z3A34 34 weeks gestation of pregnancy: Secondary | ICD-10-CM

## 2020-03-18 DIAGNOSIS — Z363 Encounter for antenatal screening for malformations: Secondary | ICD-10-CM | POA: Diagnosis not present

## 2020-03-18 DIAGNOSIS — Z3687 Encounter for antenatal screening for uncertain dates: Secondary | ICD-10-CM

## 2020-03-18 DIAGNOSIS — E669 Obesity, unspecified: Secondary | ICD-10-CM

## 2020-03-18 DIAGNOSIS — F319 Bipolar disorder, unspecified: Secondary | ICD-10-CM

## 2020-03-18 DIAGNOSIS — O0993 Supervision of high risk pregnancy, unspecified, third trimester: Secondary | ICD-10-CM

## 2020-03-18 DIAGNOSIS — O0990 Supervision of high risk pregnancy, unspecified, unspecified trimester: Secondary | ICD-10-CM

## 2020-03-18 DIAGNOSIS — O0933 Supervision of pregnancy with insufficient antenatal care, third trimester: Secondary | ICD-10-CM

## 2020-03-18 DIAGNOSIS — Z3490 Encounter for supervision of normal pregnancy, unspecified, unspecified trimester: Secondary | ICD-10-CM | POA: Insufficient documentation

## 2020-03-18 DIAGNOSIS — O0930 Supervision of pregnancy with insufficient antenatal care, unspecified trimester: Secondary | ICD-10-CM

## 2020-03-18 DIAGNOSIS — O99343 Other mental disorders complicating pregnancy, third trimester: Secondary | ICD-10-CM

## 2020-03-18 DIAGNOSIS — O99323 Drug use complicating pregnancy, third trimester: Secondary | ICD-10-CM

## 2020-03-18 DIAGNOSIS — F1410 Cocaine abuse, uncomplicated: Secondary | ICD-10-CM

## 2020-03-18 DIAGNOSIS — F2089 Other schizophrenia: Secondary | ICD-10-CM

## 2020-03-18 DIAGNOSIS — F209 Schizophrenia, unspecified: Secondary | ICD-10-CM | POA: Insufficient documentation

## 2020-03-18 DIAGNOSIS — O99213 Obesity complicating pregnancy, third trimester: Secondary | ICD-10-CM

## 2020-03-18 NOTE — Progress Notes (Signed)
Here for [place] results. Patient had unknown LMP, found today in MFM to be 34 w 3d.  Results were reviewed in MFM with patient and her guardian.   I reviewed [place] results with Vonzella Nipple, PA and then reviewed with [place] and her guardian. Informed her we will schedule new ob in our office asap.  Will go ahead and get new OB bloodwork today.  She declined Genetic screening. She declined to give urine specimen or swab for gc/ gbs. She states she will do it next time. Per her and her guardian she does not plan to keep the baby. Per her guardian she thinks she is not taking her mental health meds as prescribed.  Per her guardian she has been given notice at the home where she lives and will have to move out 04/26/20. Guardian states she is trying to find another place but needs help. I explained we will put in referral for [facility] to see if we can get help. I reviewed signs and symptoms of labor and to go to [facility] if she has signs of labor. She voices understanding.  Reannah Totten,RN

## 2020-03-18 NOTE — Progress Notes (Signed)
Chart reviewed for nurse visit. Agree with plan of care.   Marny Lowenstein, PA-C 03/18/2020 12:02 PM

## 2020-03-19 LAB — OBSTETRIC PANEL, INCLUDING HIV
Antibody Screen: NEGATIVE
Basophils Absolute: 0 10*3/uL (ref 0.0–0.2)
Basos: 0 %
EOS (ABSOLUTE): 0.1 10*3/uL (ref 0.0–0.4)
Eos: 1 %
HIV Screen 4th Generation wRfx: NONREACTIVE
Hematocrit: 37.7 % (ref 34.0–46.6)
Hemoglobin: 13.3 g/dL (ref 11.1–15.9)
Hepatitis B Surface Ag: NEGATIVE
Immature Grans (Abs): 0 10*3/uL (ref 0.0–0.1)
Immature Granulocytes: 1 %
Lymphocytes Absolute: 2.4 10*3/uL (ref 0.7–3.1)
Lymphs: 30 %
MCH: 31.8 pg (ref 26.6–33.0)
MCHC: 35.3 g/dL (ref 31.5–35.7)
MCV: 90 fL (ref 79–97)
Monocytes Absolute: 0.5 10*3/uL (ref 0.1–0.9)
Monocytes: 7 %
Neutrophils Absolute: 4.9 10*3/uL (ref 1.4–7.0)
Neutrophils: 61 %
Platelets: 243 10*3/uL (ref 150–450)
RBC: 4.18 x10E6/uL (ref 3.77–5.28)
RDW: 12.5 % (ref 11.7–15.4)
RPR Ser Ql: NONREACTIVE
Rh Factor: POSITIVE
Rubella Antibodies, IGG: 2.07 index (ref >0.99)
WBC: 8 10*3/uL (ref 3.4–10.8)

## 2020-03-19 LAB — HEMOGLOBIN A1C
Est. average glucose Bld gHb Est-mCnc: 94 mg/dL
Hgb A1c MFr Bld: 4.9 % (ref 4.8–5.6)

## 2020-03-21 ENCOUNTER — Other Ambulatory Visit: Payer: Self-pay

## 2020-03-21 ENCOUNTER — Encounter: Payer: Self-pay | Admitting: Obstetrics & Gynecology

## 2020-03-21 ENCOUNTER — Ambulatory Visit (INDEPENDENT_AMBULATORY_CARE_PROVIDER_SITE_OTHER): Payer: Medicaid Other | Admitting: Obstetrics & Gynecology

## 2020-03-21 VITALS — BP 96/57 | HR 90 | Wt 220.6 lb

## 2020-03-21 DIAGNOSIS — E669 Obesity, unspecified: Secondary | ICD-10-CM

## 2020-03-21 DIAGNOSIS — Z23 Encounter for immunization: Secondary | ICD-10-CM

## 2020-03-21 DIAGNOSIS — F1410 Cocaine abuse, uncomplicated: Secondary | ICD-10-CM

## 2020-03-21 DIAGNOSIS — F17210 Nicotine dependence, cigarettes, uncomplicated: Secondary | ICD-10-CM

## 2020-03-21 DIAGNOSIS — O99213 Obesity complicating pregnancy, third trimester: Secondary | ICD-10-CM | POA: Diagnosis not present

## 2020-03-21 DIAGNOSIS — O99333 Smoking (tobacco) complicating pregnancy, third trimester: Secondary | ICD-10-CM

## 2020-03-21 DIAGNOSIS — O99323 Drug use complicating pregnancy, third trimester: Secondary | ICD-10-CM

## 2020-03-21 DIAGNOSIS — O0990 Supervision of high risk pregnancy, unspecified, unspecified trimester: Secondary | ICD-10-CM

## 2020-03-21 DIAGNOSIS — Z3A34 34 weeks gestation of pregnancy: Secondary | ICD-10-CM

## 2020-03-21 MED ORDER — VITAFOL-OB PO TABS: 1.0000 | ORAL_TABLET | Freq: Every day | ORAL | 1 refills | Status: AC

## 2020-03-21 NOTE — Patient Instructions (Addendum)
 Third Trimester of Pregnancy The third trimester is from week 28 through week 40 (months 7 through 9). The third trimester is a time when the unborn baby (fetus) is growing rapidly. At the end of the ninth month, the fetus is about 20 inches in length and weighs 6-10 pounds. Body changes during your third trimester Your body will continue to go through many changes during pregnancy. The changes vary from woman to woman. During the third trimester:  Your weight will continue to increase. You can expect to gain 25-35 pounds (11-16 kg) by the end of the pregnancy.  You may begin to get stretch marks on your hips, abdomen, and breasts.  You may urinate more often because the fetus is moving lower into your pelvis and pressing on your bladder.  You may develop or continue to have heartburn. This is caused by increased hormones that slow down muscles in the digestive tract.  You may develop or continue to have constipation because increased hormones slow digestion and cause the muscles that push waste through your intestines to relax.  You may develop hemorrhoids. These are swollen veins (varicose veins) in the rectum that can itch or be painful.  You may develop swollen, bulging veins (varicose veins) in your legs.  You may have increased body aches in the pelvis, back, or thighs. This is due to weight gain and increased hormones that are relaxing your joints.  You may have changes in your hair. These can include thickening of your hair, rapid growth, and changes in texture. Some women also have hair loss during or after pregnancy, or hair that feels dry or thin. Your hair will most likely return to normal after your baby is born.  Your breasts will continue to grow and they will continue to become tender. A yellow fluid (colostrum) may leak from your breasts. This is the first milk you are producing for your baby.  Your belly button may stick out.  You may notice more swelling in your  hands, face, or ankles.  You may have increased tingling or numbness in your hands, arms, and legs. The skin on your belly may also feel numb.  You may feel short of breath because of your expanding uterus.  You may have more problems sleeping. This can be caused by the size of your belly, increased need to urinate, and an increase in your body's metabolism.  You may notice the fetus "dropping," or moving lower in your abdomen (lightening).  You may have increased vaginal discharge.  You may notice your joints feel loose and you may have pain around your pelvic bone. What to expect at prenatal visits You will have prenatal exams every 2 weeks until week 36. Then you will have weekly prenatal exams. During a routine prenatal visit:  You will be weighed to make sure you and the baby are growing normally.  Your blood pressure will be taken.  Your abdomen will be measured to track your baby's growth.  The fetal heartbeat will be listened to.  Any test results from the previous visit will be discussed.  You may have a cervical check near your due date to see if your cervix has softened or thinned (effaced).  You will be tested for Group B streptococcus. This happens between 35 and 37 weeks. Your health care provider may ask you:  What your birth plan is.  How you are feeling.  If you are feeling the baby move.  If you have had any   abnormal symptoms, such as leaking fluid, bleeding, severe headaches, or abdominal cramping.  If you are using any tobacco products, including cigarettes, chewing tobacco, and electronic cigarettes.  If you have any questions. Other tests or screenings that may be performed during your third trimester include:  Blood tests that check for low iron levels (anemia).  Fetal testing to check the health, activity level, and growth of the fetus. Testing is done if you have certain medical conditions or if there are problems during the  pregnancy.  Nonstress test (NST). This test checks the health of your baby to make sure there are no signs of problems, such as the baby not getting enough oxygen. During this test, a belt is placed around your belly. The baby is made to move, and its heart rate is monitored during movement. What is false labor? False labor is a condition in which you feel small, irregular tightenings of the muscles in the womb (contractions) that usually go away with rest, changing position, or drinking water. These are called Braxton Hicks contractions. Contractions may last for hours, days, or even weeks before true labor sets in. If contractions come at regular intervals, become more frequent, increase in intensity, or become painful, you should see your health care provider. What are the signs of labor?  Abdominal cramps.  Regular contractions that start at 10 minutes apart and become stronger and more frequent with time.  Contractions that start on the top of the uterus and spread down to the lower abdomen and back.  Increased pelvic pressure and dull back pain.  A watery or bloody mucus discharge that comes from the vagina.  Leaking of amniotic fluid. This is also known as your "water breaking." It could be a slow trickle or a gush. Let your health care provider know if it has a color or strange odor. If you have any of these signs, call your health care provider right away, even if it is before your due date. Follow these instructions at home: Medicines  Follow your health care provider's instructions regarding medicine use. Specific medicines may be either safe or unsafe to take during pregnancy.  Take a prenatal vitamin that contains at least 600 micrograms (mcg) of folic acid.  If you develop constipation, try taking a stool softener if your health care provider approves. Eating and drinking   Eat a balanced diet that includes fresh fruits and vegetables, whole grains, good sources of protein  such as meat, eggs, or tofu, and low-fat dairy. Your health care provider will help you determine the amount of weight gain that is right for you.  Avoid raw meat and uncooked cheese. These carry germs that can cause birth defects in the baby.  If you have low calcium intake from food, talk to your health care provider about whether you should take a daily calcium supplement.  Eat four or five small meals rather than three large meals a day.  Limit foods that are high in fat and processed sugars, such as fried and sweet foods.  To prevent constipation: ? Drink enough fluid to keep your urine clear or pale yellow. ? Eat foods that are high in fiber, such as fresh fruits and vegetables, whole grains, and beans. Activity  Exercise only as directed by your health care provider. Most women can continue their usual exercise routine during pregnancy. Try to exercise for 30 minutes at least 5 days a week. Stop exercising if you experience uterine contractions.  Avoid heavy lifting.    Do not exercise in extreme heat or humidity, or at high altitudes.  Wear low-heel, comfortable shoes.  Practice good posture.  You may continue to have sex unless your health care provider tells you otherwise. Relieving pain and discomfort  Take frequent breaks and rest with your legs elevated if you have leg cramps or low back pain.  Take warm sitz baths to soothe any pain or discomfort caused by hemorrhoids. Use hemorrhoid cream if your health care provider approves.  Wear a good support bra to prevent discomfort from breast tenderness.  If you develop varicose veins: ? Wear support pantyhose or compression stockings as told by your healthcare provider. ? Elevate your feet for 15 minutes, 3-4 times a day. Prenatal care  Write down your questions. Take them to your prenatal visits.  Keep all your prenatal visits as told by your health care provider. This is important. Safety  Wear your seat belt at  all times when driving.  Make a list of emergency phone numbers, including numbers for family, friends, the hospital, and police and fire departments. General instructions  Avoid cat litter boxes and soil used by cats. These carry germs that can cause birth defects in the baby. If you have a cat, ask someone to clean the litter box for you.  Do not travel far distances unless it is absolutely necessary and only with the approval of your health care provider.  Do not use hot tubs, steam rooms, or saunas.  Do not drink alcohol.  Do not use any products that contain nicotine or tobacco, such as cigarettes and e-cigarettes. If you need help quitting, ask your health care provider.  Do not use any medicinal herbs or unprescribed drugs. These chemicals affect the formation and growth of the baby.  Do not douche or use tampons or scented sanitary pads.  Do not cross your legs for long periods of time.  To prepare for the arrival of your baby: ? Take prenatal classes to understand, practice, and ask questions about labor and delivery. ? Make a trial run to the hospital. ? Visit the hospital and tour the maternity area. ? Arrange for maternity or paternity leave through employers. ? Arrange for family and friends to take care of pets while you are in the hospital. ? Purchase a rear-facing car seat and make sure you know how to install it in your car. ? Pack your hospital bag. ? Prepare the baby's nursery. Make sure to remove all pillows and stuffed animals from the baby's crib to prevent suffocation.  Visit your dentist if you have not gone during your pregnancy. Use a soft toothbrush to brush your teeth and be gentle when you floss. Contact a health care provider if:  You are unsure if you are in labor or if your water has broken.  You become dizzy.  You have mild pelvic cramps, pelvic pressure, or nagging pain in your abdominal area.  You have lower back pain.  You have persistent  nausea, vomiting, or diarrhea.  You have an unusual or bad smelling vaginal discharge.  You have pain when you urinate. Get help right away if:  Your water breaks before 37 weeks.  You have regular contractions less than 5 minutes apart before 37 weeks.  You have a fever.  You are leaking fluid from your vagina.  You have spotting or bleeding from your vagina.  You have severe abdominal pain or cramping.  You have rapid weight loss or weight gain.  You   have shortness of breath with chest pain.  You notice sudden or extreme swelling of your face, hands, ankles, feet, or legs.  Your baby makes fewer than 10 movements in 2 hours.  You have severe headaches that do not go away when you take medicine.  You have vision changes. Summary  The third trimester is from week 28 through week 40, months 7 through 9. The third trimester is a time when the unborn baby (fetus) is growing rapidly.  During the third trimester, your discomfort may increase as you and your baby continue to gain weight. You may have abdominal, leg, and back pain, sleeping problems, and an increased need to urinate.  During the third trimester your breasts will keep growing and they will continue to become tender. A yellow fluid (colostrum) may leak from your breasts. This is the first milk you are producing for your baby.  False labor is a condition in which you feel small, irregular tightenings of the muscles in the womb (contractions) that eventually go away. These are called Braxton Hicks contractions. Contractions may last for hours, days, or even weeks before true labor sets in.  Signs of labor can include: abdominal cramps; regular contractions that start at 10 minutes apart and become stronger and more frequent with time; watery or bloody mucus discharge that comes from the vagina; increased pelvic pressure and dull back pain; and leaking of amniotic fluid. This information is not intended to replace advice  given to you by your health care provider. Make sure you discuss any questions you have with your health care provider. Document Revised: 01/22/2019 Document Reviewed: 11/06/2016 Elsevier Patient Education  [employer].    *[address] [facility]

## 2020-03-21 NOTE — Progress Notes (Signed)
  Subjective:    Lindsay Perry is a G1P0 [redacted]w[redacted]d being seen today for her first obstetrical visit.  Her obstetrical history is significant for obesity and H/o schizophrenia, bipolar; has not been on her reg meds for past 6 months. Also concern for continued substance abuse; cocaine. . Followed by [facility] for Psychiatric care. Denies HI/SI. No recent manic/depressive/schziophrenic episodes.  Next appt with psych in 2-3 weeks.  Desires to put baby up for adoption.  Pregnancy history fully reviewed.  Desires IUD immediately after delivery. Tobacco use, 2-5 cigs/day. Trying to cut back.   Patient reports no bleeding, no cramping, no leaking and occasional contractions.  Vitals:   03/21/20 1041  BP: (!) 96/57  Pulse: 90  Weight: 100.1 kg    HISTORY: OB History  Gravida Para Term Preterm AB Living  1            SAB TAB Ectopic Multiple Live Births               # Outcome Date GA Lbr Len/2nd Weight Sex Delivery Anes PTL Lv  1 Current            Past Medical History:  Diagnosis Date  . Asthma   . Bipolar 1 disorder (HCC)   . Schizophrenia (HCC)    No past surgical history on file. No family history on file.   Exam    Uterus:   34 wks   Pelvic Exam:    Perineum: No Hemorrhoids   Vulva: normal   Vagina:  normal mucosa   Cervix: no bleeding following Pap and no cervical motion tenderness   Adnexa: no mass, fullness, tenderness   Bony Pelvis: gynecoid  System: Breast:  Inspection negative   Skin: normal coloration and turgor, no rashes    Neurologic: oriented, normal, negative   Extremities: normal strength, tone, and muscle mass   HEENT PERRLA   Cardiovascular: regular rate and rhythm   Respiratory:  appears well, vitals normal, no respiratory distress, acyanotic, normal RR, ear and throat exam is normal, neck free of mass or lymphadenopathy, chest clear, no wheezing, crepitations, rhonchi, normal symmetric air entry   Abdomen: soft, non-tender; bowel sounds normal; no  masses,  no organomegaly   Urinary: urethral meatus normal      Assessment:    Pregnancy: G1P0 Patient Active Problem List   Diagnosis Date Noted  . Supervision of high risk pregnancy, antepartum 03/18/2020  . Schizophrenia (HCC)   . Bipolar 1 disorder (HCC)         Plan:     Initial labs drawn. Prenatal vitamins. Problem list reviewed and updated. Late prenatal care.  Ultrasound discussed; fetal survey: requested.  Follow up in 1 weeks. 85% of 20 min visit spent on counseling and coordination of care.   Risks of drugs use/smoking in pregnancy discussed, smoking cessation strongly encouraged. UDS ordered for substance h/o cocaine use.  Will have social work Actuary and help facilitate patient wishes.    Malachy Chamber 03/21/2020

## 2020-03-21 NOTE — Progress Notes (Signed)
High PHQ-9 states mom filled it out. States its almost true but don't want to discuss it. Plans are not to keep the baby  thinking of adoption and resources for that. Wants to take baby away right away.

## 2020-03-22 ENCOUNTER — Institutional Professional Consult (permissible substitution): Payer: Medicaid Other

## 2020-03-23 LAB — CULTURE, OB URINE

## 2020-03-23 LAB — URINE CULTURE, OB REFLEX

## 2020-03-24 LAB — TOXASSURE SELECT 13 (MW), URINE

## 2020-03-28 ENCOUNTER — Encounter: Payer: Self-pay | Admitting: *Deleted

## 2020-03-31 ENCOUNTER — Inpatient Hospital Stay (HOSPITAL_COMMUNITY)
Admission: AD | Admit: 2020-03-31 | Discharge: 2020-04-01 | DRG: 806 | Disposition: A | Discharge status: Home / Self Care | Payer: Medicaid Other | Attending: Family Medicine | Admitting: Family Medicine

## 2020-03-31 ENCOUNTER — Other Ambulatory Visit: Payer: Self-pay

## 2020-03-31 ENCOUNTER — Encounter (HOSPITAL_COMMUNITY): Payer: Self-pay | Admitting: Obstetrics & Gynecology

## 2020-03-31 DIAGNOSIS — Z30430 Encounter for insertion of intrauterine contraceptive device: Secondary | ICD-10-CM | POA: Diagnosis not present

## 2020-03-31 DIAGNOSIS — F319 Bipolar disorder, unspecified: Secondary | ICD-10-CM | POA: Diagnosis present

## 2020-03-31 DIAGNOSIS — F1491 Cocaine use, unspecified, in remission: Secondary | ICD-10-CM

## 2020-03-31 DIAGNOSIS — F1410 Cocaine abuse, uncomplicated: Secondary | ICD-10-CM | POA: Diagnosis present

## 2020-03-31 DIAGNOSIS — O99324 Drug use complicating childbirth: Secondary | ICD-10-CM | POA: Diagnosis present

## 2020-03-31 DIAGNOSIS — F209 Schizophrenia, unspecified: Secondary | ICD-10-CM | POA: Diagnosis present

## 2020-03-31 DIAGNOSIS — F17213 Nicotine dependence, cigarettes, with withdrawal: Secondary | ICD-10-CM | POA: Diagnosis present

## 2020-03-31 DIAGNOSIS — Z20822 Contact with and (suspected) exposure to covid-19: Secondary | ICD-10-CM | POA: Diagnosis present

## 2020-03-31 DIAGNOSIS — O99344 Other mental disorders complicating childbirth: Secondary | ICD-10-CM | POA: Diagnosis present

## 2020-03-31 DIAGNOSIS — Z3A36 36 weeks gestation of pregnancy: Secondary | ICD-10-CM

## 2020-03-31 DIAGNOSIS — O99334 Smoking (tobacco) complicating childbirth: Secondary | ICD-10-CM | POA: Diagnosis present

## 2020-03-31 DIAGNOSIS — O42913 Preterm premature rupture of membranes, unspecified as to length of time between rupture and onset of labor, third trimester: Secondary | ICD-10-CM | POA: Diagnosis present

## 2020-03-31 DIAGNOSIS — O0990 Supervision of high risk pregnancy, unspecified, unspecified trimester: Secondary | ICD-10-CM

## 2020-03-31 LAB — CBC
HCT: 40 % (ref 36.0–46.0)
Hemoglobin: 13.6 g/dL (ref 12.0–15.0)
MCH: 31.1 pg (ref 26.0–34.0)
MCHC: 34 g/dL (ref 30.0–36.0)
MCV: 91.5 fL (ref 80.0–100.0)
Platelets: 238 10*3/uL (ref 150–400)
RBC: 4.37 MIL/uL (ref 3.87–5.11)
RDW: 12.8 % (ref 11.5–15.5)
WBC: 13.6 10*3/uL — ABNORMAL HIGH (ref 4.0–10.5)
nRBC: 0 % (ref 0.0–0.2)

## 2020-03-31 LAB — RPR: RPR Ser Ql: NONREACTIVE

## 2020-03-31 LAB — TYPE AND SCREEN
ABO/RH(D): A POS
Antibody Screen: NEGATIVE

## 2020-03-31 LAB — SARS CORONAVIRUS 2 (TAT 6-24 HRS): SARS Coronavirus 2: NEGATIVE

## 2020-03-31 LAB — HEPATITIS C ANTIBODY: HCV Ab: NONREACTIVE

## 2020-03-31 LAB — ABO/RH: ABO/RH(D): A POS

## 2020-03-31 MED ORDER — LACTATED RINGERS IV SOLN: 500.0000 mL | INTRAVENOUS | Status: DC | PRN

## 2020-03-31 MED ORDER — MISOPROSTOL 25 MCG QUARTER TABLET
25.0000 ug | ORAL_TABLET | ORAL | Status: DC
  Administered 2020-03-31: 25 ug via VAGINAL
  Filled 2020-03-31: qty 1

## 2020-03-31 MED ORDER — FENTANYL CITRATE (PF) 100 MCG/2ML IJ SOLN
INTRAMUSCULAR | Status: AC
  Filled 2020-03-31: qty 2

## 2020-03-31 MED ORDER — ONDANSETRON HCL 4 MG PO TABS: 4.0000 mg | ORAL_TABLET | ORAL | Status: DC | PRN

## 2020-03-31 MED ORDER — PENICILLIN G POT IN DEXTROSE 60000 UNIT/ML IV SOLN
3.0000 10*6.[IU] | INTRAVENOUS | Status: DC
  Administered 2020-03-31 (×3): 3 10*6.[IU] via INTRAVENOUS
  Filled 2020-03-31 (×3): qty 50

## 2020-03-31 MED ORDER — DIPHENHYDRAMINE HCL 25 MG PO CAPS: 25.0000 mg | ORAL_CAPSULE | Freq: Four times a day (QID) | ORAL | Status: DC | PRN

## 2020-03-31 MED ORDER — ARIPIPRAZOLE 5 MG PO TABS
5.0000 mg | ORAL_TABLET | Freq: Every day | ORAL | Status: DC
  Administered 2020-03-31 – 2020-04-01 (×2): 5 mg via ORAL
  Filled 2020-03-31 (×2): qty 1

## 2020-03-31 MED ORDER — FOLIC ACID 1 MG PO TABS
1.0000 mg | ORAL_TABLET | Freq: Every day | ORAL | Status: DC
  Administered 2020-03-31: 1 mg via ORAL
  Filled 2020-03-31: qty 1

## 2020-03-31 MED ORDER — BETAMETHASONE SOD PHOS & ACET 6 (3-3) MG/ML IJ SUSP
12.0000 mg | INTRAMUSCULAR | Status: DC
  Administered 2020-03-31: 12 mg via INTRAMUSCULAR
  Filled 2020-03-31: qty 5

## 2020-03-31 MED ORDER — OXYTOCIN BOLUS FROM INFUSION
333.0000 mL | Freq: Once | INTRAVENOUS | Status: AC
  Administered 2020-03-31: 333 mL via INTRAVENOUS

## 2020-03-31 MED ORDER — SOD CITRATE-CITRIC ACID 500-334 MG/5ML PO SOLN: 30.0000 mL | ORAL | Status: DC | PRN

## 2020-03-31 MED ORDER — RISPERIDONE 2 MG PO TABS
2.0000 mg | ORAL_TABLET | Freq: Every day | ORAL | Status: DC
  Administered 2020-03-31 – 2020-04-01 (×2): 2 mg via ORAL
  Filled 2020-03-31 (×2): qty 1

## 2020-03-31 MED ORDER — MISOPROSTOL 25 MCG QUARTER TABLET
ORAL_TABLET | ORAL | Status: AC
  Filled 2020-03-31: qty 1

## 2020-03-31 MED ORDER — LIDOCAINE HCL (PF) 1 % IJ SOLN
30.0000 mL | INTRAMUSCULAR | Status: DC | PRN
  Filled 2020-03-31: qty 30

## 2020-03-31 MED ORDER — WITCH HAZEL-GLYCERIN EX PADS: 1.0000 "application " | MEDICATED_PAD | CUTANEOUS | Status: DC | PRN

## 2020-03-31 MED ORDER — ACETAMINOPHEN 325 MG PO TABS: 650.0000 mg | ORAL_TABLET | ORAL | Status: DC | PRN

## 2020-03-31 MED ORDER — LEVONORGESTREL 19.5 MCG/DAY IU IUD
INTRAUTERINE_SYSTEM | Freq: Once | INTRAUTERINE | Status: AC
  Filled 2020-03-31: qty 1

## 2020-03-31 MED ORDER — NICOTINE POLACRILEX 2 MG MT GUM
2.0000 mg | CHEWING_GUM | OROMUCOSAL | Status: DC | PRN
  Administered 2020-03-31 – 2020-04-01 (×2): 2 mg via ORAL
  Filled 2020-03-31 (×3): qty 1

## 2020-03-31 MED ORDER — ONDANSETRON HCL 4 MG/2ML IJ SOLN: 4.0000 mg | INTRAMUSCULAR | Status: DC | PRN

## 2020-03-31 MED ORDER — OXYCODONE HCL 5 MG PO TABS: 10.0000 mg | ORAL_TABLET | ORAL | Status: DC | PRN

## 2020-03-31 MED ORDER — BENZOCAINE-MENTHOL 20-0.5 % EX AERO
1.0000 "application " | INHALATION_SPRAY | CUTANEOUS | Status: DC | PRN
  Administered 2020-03-31: 1 via TOPICAL
  Filled 2020-03-31: qty 56

## 2020-03-31 MED ORDER — COCONUT OIL OIL: 1.0000 "application " | TOPICAL_OIL | Status: DC | PRN

## 2020-03-31 MED ORDER — TETANUS-DIPHTH-ACELL PERTUSSIS 5-2.5-18.5 LF-MCG/0.5 IM SUSP: 0.5000 mL | Freq: Once | INTRAMUSCULAR | Status: DC

## 2020-03-31 MED ORDER — IBUPROFEN 600 MG PO TABS
600.0000 mg | ORAL_TABLET | Freq: Four times a day (QID) | ORAL | Status: DC
  Administered 2020-03-31 – 2020-04-01 (×3): 600 mg via ORAL
  Filled 2020-03-31 (×3): qty 1

## 2020-03-31 MED ORDER — DIBUCAINE (PERIANAL) 1 % EX OINT: 1.0000 "application " | TOPICAL_OINTMENT | CUTANEOUS | Status: DC | PRN

## 2020-03-31 MED ORDER — PRENATAL MULTIVITAMIN CH
1.0000 | ORAL_TABLET | Freq: Every day | ORAL | Status: DC
  Administered 2020-04-01: 1 via ORAL
  Filled 2020-03-31: qty 1

## 2020-03-31 MED ORDER — NICOTINE 21 MG/24HR TD PT24
21.0000 mg | MEDICATED_PATCH | Freq: Every day | TRANSDERMAL | Status: DC
  Administered 2020-03-31: 21 mg via TRANSDERMAL
  Filled 2020-03-31 (×2): qty 1

## 2020-03-31 MED ORDER — OXYCODONE-ACETAMINOPHEN 5-325 MG PO TABS: 2.0000 | ORAL_TABLET | ORAL | Status: DC | PRN

## 2020-03-31 MED ORDER — ZOLPIDEM TARTRATE 5 MG PO TABS: 5.0000 mg | ORAL_TABLET | Freq: Every evening | ORAL | Status: DC | PRN

## 2020-03-31 MED ORDER — OXYTOCIN-SODIUM CHLORIDE 30-0.9 UT/500ML-% IV SOLN
1.0000 m[IU]/min | INTRAVENOUS | Status: DC
  Administered 2020-03-31: 2 m[IU]/min via INTRAVENOUS
  Filled 2020-03-31: qty 500

## 2020-03-31 MED ORDER — OXYTOCIN-SODIUM CHLORIDE 30-0.9 UT/500ML-% IV SOLN: 2.5000 [IU]/h | INTRAVENOUS | Status: DC

## 2020-03-31 MED ORDER — LACTATED RINGERS IV SOLN: INTRAVENOUS | Status: DC

## 2020-03-31 MED ORDER — TERBUTALINE SULFATE 1 MG/ML IJ SOLN: 0.2500 mg | Freq: Once | INTRAMUSCULAR | Status: DC | PRN

## 2020-03-31 MED ORDER — SODIUM CHLORIDE 0.9 % IV SOLN
5.0000 10*6.[IU] | Freq: Once | INTRAVENOUS | Status: AC
  Administered 2020-03-31: 5 10*6.[IU] via INTRAVENOUS
  Filled 2020-03-31: qty 5

## 2020-03-31 MED ORDER — FENTANYL CITRATE (PF) 100 MCG/2ML IJ SOLN
100.0000 ug | INTRAMUSCULAR | Status: DC | PRN
  Administered 2020-03-31 (×3): 100 ug via INTRAVENOUS
  Filled 2020-03-31 (×2): qty 2

## 2020-03-31 MED ORDER — SENNOSIDES-DOCUSATE SODIUM 8.6-50 MG PO TABS
2.0000 | ORAL_TABLET | ORAL | Status: DC
  Administered 2020-03-31: 2 via ORAL
  Filled 2020-03-31: qty 2

## 2020-03-31 MED ORDER — BENZTROPINE MESYLATE 0.5 MG PO TABS
0.5000 mg | ORAL_TABLET | Freq: Two times a day (BID) | ORAL | Status: DC
  Administered 2020-03-31 – 2020-04-01 (×3): 0.5 mg via ORAL
  Filled 2020-03-31 (×4): qty 1

## 2020-03-31 MED ORDER — OXYCODONE HCL 5 MG PO TABS: 5.0000 mg | ORAL_TABLET | ORAL | Status: DC | PRN

## 2020-03-31 MED ORDER — ONDANSETRON HCL 4 MG/2ML IJ SOLN: 4.0000 mg | Freq: Four times a day (QID) | INTRAMUSCULAR | Status: DC | PRN

## 2020-03-31 MED ORDER — SIMETHICONE 80 MG PO CHEW: 80.0000 mg | CHEWABLE_TABLET | ORAL | Status: DC | PRN

## 2020-03-31 NOTE — Discharge Summary (Signed)
Postpartum Discharge Summary  Date of Service updated 04/01/2020     Patient Name: Lindsay Perry DOB: [birth year] MRN: [id number]  Date of admission: 03/31/2020 Delivery date:03/31/2020  Delivering provider: Merilyn Baba  Date of discharge: 04/01/2020 Admitting diagnosis: Labor and delivery, indication for care [O75.9] Intrauterine pregnancy: [redacted]w[redacted]d    Secondary diagnosis:  Active Problems:   [place]ia ([place]   Bipolar 1 disorder ([place]   Labor and delivery, indication for care   Encounter for initial insertion of intrauterine contraceptive device   History of cocaine use  Additional problems: no    Discharge diagnosis: Preterm Pregnancy Delivered                                              Post partum procedures: post placental IUD Augmentation: Pitocin, Cytotec and IP Foley Complications: None  Hospital course: Onset of Labor With Vaginal Delivery      29y.o. yo G1P0 at 349w2das admitted for PPROM on 03/31/2020. Patient had an uncomplicated labor course as follows:   No cervical change after PPROM. She was given cytotec and eventually a FB. Pitocin was started after FB came out and she eventually progressed to complete, delivering shortly after.  Membrane Rupture Time/Date: 4:30 AM ,03/31/2020   Delivery Method:Vaginal, Spontaneous  Episiotomy: None  Lacerations:  Labial;2nd degree  Patient had an uncomplicated postpartum course.  She is ambulating, tolerating a regular diet, passing flatus, and urinating well. Patient is discharged home in stable condition on 04/01/20.  Newborn Data: Birth date:03/31/2020  Birth time:6:13 PM  Gender:Female  Living status:Living  Apgars:9 ,9  Weight:2565 g   Magnesium Sulfate received: No BMZ received: Yes Rhophylac:N/A MMR:N/A T-DaP:Given prenatally Flu: N/A Transfusion:No  Physical exam  Vitals:   04/01/20 0108 04/01/20 0428 04/01/20 0816 04/01/20 1120  BP: 115/63 99/67 (!) 99/52 110/64  Pulse: 71 61 70 60   Resp: 20 18 18 18   Temp: 97.7 F (36.5 C) 97.6 F (36.4 C) (!) 97.4 F (36.3 C) (!) 97.5 F (36.4 C)  TempSrc: Oral Oral Oral Oral  SpO2: 97% 98% 98% 97%  Weight:      Height:       General: alert, cooperative and no distress Lochia: appropriate Uterine Fundus: firm Incision: N/A DVT Evaluation: No evidence of DVT seen on physical exam. Labs: Lab Results  Component Value Date   WBC 13.6 (H) 03/31/2020   HGB 13.6 03/31/2020   HCT 40.0 03/31/2020   MCV 91.5 03/31/2020   PLT 238 03/31/2020   CMP Latest Ref Rng & Units 01/12/2020  Glucose 70 - 99 mg/dL 111(H)  BUN 6 - 20 mg/dL <5(L)  Creatinine 0.44 - 1.00 mg/dL 0.52  Sodium 135 - 145 mmol/L 136  Potassium 3.5 - 5.1 mmol/L 3.5  Chloride 98 - 111 mmol/L 104  CO2 22 - 32 mmol/L 21(L)  Calcium 8.9 - 10.3 mg/dL 9.5  Total Protein 6.0 - 8.3 g/dL -  Total Bilirubin 0.3 - 1.2 mg/dL -  Alkaline Phos 39 - 117 U/L -  AST 0 - 37 U/L -  ALT 0 - 35 U/L -   Edinburgh Score: Edinburgh Postnatal Depression Scale Screening Tool 04/01/2020  I have been able to laugh and see the funny side of things. 3  I have looked forward with enjoyment to things. 0  I have blamed myself unnecessarily  when things went wrong. 2  I have been anxious or worried for no good reason. 2  I have felt scared or panicky for no good reason. 2  Things have been getting on top of me. 1  I have been so unhappy that I have had difficulty sleeping. 2  I have felt sad or miserable. 0  I have been so unhappy that I have been crying. 1  The thought of harming myself has occurred to me. 0  Edinburgh Postnatal Depression Scale Total 13     After visit meds:  Allergies as of 04/01/2020   No Known Allergies     Medication List    STOP taking these medications   acetaminophen 500 MG tablet Commonly known as: TYLENOL   folic acid 0.5 MG tablet Commonly known as: FOLVITE   mupirocin ointment 2 % Commonly known as: BACTROBAN     TAKE these medications    ARIPiprazole 5 MG tablet Commonly known as: ABILIFY Take 5 mg by mouth daily.   benztropine 0.5 MG tablet Commonly known as: COGENTIN Take 0.5 mg by mouth 2 (two) times daily.   ibuprofen 600 MG tablet Commonly known as: ADVIL Take 1 tablet (600 mg total) by mouth every 6 (six) hours.   risperiDONE 2 MG tablet Commonly known as: RISPERDAL Take 2 mg by mouth daily.   Vitafol-OB Tabs Take 1 tablet by mouth daily.        Discharge home in stable condition Infant Feeding: Bottle Infant Disposition: Baby up for adoption Discharge instruction: per After Visit Summary and Postpartum booklet. Activity: Advance as tolerated. Pelvic rest for 6 weeks.  Diet: routine diet Future Appointments: Future Appointments  Date Time Provider [place]  04/06/2020  1:15 PM [place] [facility]  04/08/2020 10:30 AM [facility] US3 [facility]  05/02/2020 10:55 AM [place], Rona Ravens, NP [facility]   Follow up Visit:  [place] [facility] [place] [facility] Follow up in 4 week(s).   Specialty: Obstetrics and Gynecology Contact information: [place] [address] [phone]             Please schedule this patient for Postpartum visit in: 4-6 weeks with the following provider: Any provider, in person For C/S patients schedule nurse incision check in weeks 2 weeks: no High risk pregnancy complicated by: PPROM, schizophrenia, bipolar, cocaine/tobacco, unstable housing Delivery mode:  SVD Anticipated Birth Control:  IUD done post-placental- needs string check at pp visit PP Procedures needed: pp IUD string check  Schedule Integrated [place] visit: no   04/01/2020 Emeterio Reeve, MD

## 2020-03-31 NOTE — MAU Note (Signed)
After explaining test to pt GBS swab obtained and pt tol well.

## 2020-03-31 NOTE — Progress Notes (Signed)
Patient ID: Lindsay Perry, female   DOB: 1991/01/26, 29 y.o.   MRN: [id number]  Lindsay Perry is a 29 y.o. G1P0 at [redacted]w[redacted]d admitted for PPROM  Subjective: Feeling some intermittent cramping.  She and mom are agreeable to Foley bulb insertion.  Objective: BP 111/72   Pulse 74   Temp 98.3 F (36.8 C) (Oral)   Resp 16   Ht 5\' 4"  (1.626 m)   Wt 100.1 kg   LMP 12/02/2019   BMI 37.88 kg/m  No intake/output data recorded.  FHT:  FHR: 140 bpm, variability: marked,  accelerations:  Present,  decelerations:  1 variable UC:   none  SVE:   Dilation: 2 Effacement (%): 90 Station: -1 Exam by:: Lige Lakeman   Labs: Lab Results  Component Value Date   WBC 13.6 (H) 03/31/2020   HGB 13.6 03/31/2020   HCT 40.0 03/31/2020   MCV 91.5 03/31/2020   PLT 238 03/31/2020    Assessment / Plan: 29 y.o.G1P0 at [redacted]w[redacted]d here for PPROM.  Labor: PPROM. S/p Cytotec x.  S/p betamethasone for fetal lung maturity.  Consented for PP IUD - Mom [redacted]w[redacted]d) signed, as she is legal guardian.  Foley bulb place. Fetal Wellbeing:  Category II, but overall reassuring due to variability and only one decel.  Pain Control:  IV pain meds I/D:  GBS PCR pending, treating with penicillin given prematurity.  Hep C negative.  GC pending. Anticipated MOD:  SVD Polysubstance use; patient reported use of cocaine and tobacco, offer nicotine patch, SW consult, UDS  Late to prenatal care: Established at [redacted] weeks GA, CSW consult Plan for adoption of infant: CSW consult to coordinate services  Schizophrenia/BPD: Home meds include risperdal, abilify and cogentin, follows with behavior health, will continue home meds as prescribed Unstable housing:CSW consult.  Mom would like to get her into a group home.  Will lose current housing in about 1 month.   Elna Radovich Psychiatrist, MD PGY-2 Resident Family Medicine 03/31/2020, 3:19 PM

## 2020-03-31 NOTE — BH Specialist Note (Deleted)
Integrated Behavioral Health via Telemedicine Video (Caregility) Visit  03/31/2020 Cadyn Rodger [id number]  Number of Integrated Behavioral Health visits: 1 Session Start time: 1:15***  Session End time: 2:15*** Total time: {IBH Total Time:21014050}  Referring Provider: *** Type of Visit: Video Patient/Family location: Home [facility] Provider [facility]  All persons participating in visit: Patient *** and [facility] Perrin Eddleman ***   Confirmed patient's address: Yes  Confirmed patient's phone number: Yes  Any changes to demographics: No   Confirmed patient's insurance: Yes  Any changes to patient's insurance: No   Discussed confidentiality: Yes ***  I connected with Vivien Rota by a video enabled telemedicine application (Caregility) and verified that I am speaking with the correct person using two identifiers.     I discussed the limitations of evaluation and management by telemedicine and the availability of in person appointments.  I discussed that the purpose of this visit is to provide behavioral health care while limiting exposure to the novel coronavirus.   Discussed there is a possibility of technology failure and discussed alternative modes of communication if that failure occurs.  I discussed that engaging in this virtual visit, they consent to the provision of behavioral healthcare and the services will be billed under their insurance.  Patient and/or legal guardian expressed understanding and consented to virtual visit: Yes   PRESENTING CONCERNS: Patient and/or family reports the following symptoms/concerns: *** Duration of problem: ***; Severity of problem: {Mild/Moderate/Severe:20260}  STRENGTHS (Protective Factors/Coping Skills): ***  GOALS ADDRESSED: Patient will: 1.  Reduce symptoms of: {IBH Symptoms:21014056}  2.  Increase knowledge and/or ability of: {IBH Patient Tools:21014057}  3.  Demonstrate  ability to: {IBH Goals:21014053}  INTERVENTIONS: Interventions utilized:  {IBH Interventions:21014054} Standardized Assessments completed: {IBH Screening Tools:21014051}  ASSESSMENT: Patient currently experiencing Bipolar 1 disorder, Schizophrenia (both as previously diagnosed via psychiatry) ***.   Patient may benefit from psychoeducation and brief therapeutic interventions regarding coping with symptoms of *** .  PLAN: 1. Follow up with behavioral health clinician on : *** 2. Behavioral recommendations:  -*** -*** 3. Referral(s): {IBH Referrals:21014055}  I discussed the assessment and treatment plan with the patient and/or parent/guardian. They were provided an opportunity to ask questions and all were answered. They agreed with the plan and demonstrated an understanding of the instructions.   They were advised to call back or seek an in-person evaluation if the symptoms worsen or if the condition fails to improve as anticipated.  Valetta Close Keyonia Gluth

## 2020-03-31 NOTE — MAU Note (Signed)
...  Lindsay Perry is a 29 y.o. at [redacted]w[redacted]d here in MAU reporting: CTX that began about two days ago. Pt reports she was lying in bed and at 0450 felt a gush of yellow/clear fluids with no foul smell. +FM. No bleeding.  Onset of complaint: 03/31/20 at 0450 Pain score: 8/10 during CTX There were no vitals filed for this visit.   FHT: 138 initial FHR

## 2020-03-31 NOTE — Discharge Instructions (Signed)

## 2020-03-31 NOTE — H&P (Addendum)
OBSTETRIC ADMISSION HISTORY AND PHYSICAL  Lindsay Perry is a 29 y.o. female G88P0 with IUP at [redacted]w[redacted]d by 34 week u/s presenting for PPROM. She reports +FMs, + LOF, no VB, no blurry vision, headaches or peripheral edema, and RUQ pain.  She plans on adoption, so likely bottle feeding. She requests PP IUD for birth control. She received her prenatal care at [facility]   Dating: By 34 week U/S --->  Estimated Date of Delivery: 04/26/20  Sono:   @[redacted]w[redacted]d , CWD, normal anatomy, cephalic presentation,  fundal placenta, 2636g, 70% EFW  Prenatal History/Complications: -Schizophrenia (Cogentin,  -Bipolar I Disorder  -Polysubstance Use (cocaine and tobacco) -impending homelessness  -BUFA -Preterm  Past Medical History: Past Medical History:  Diagnosis Date  . Asthma   . Bipolar 1 disorder ([place])   . Schizophrenia [facility])    Past Surgical History: History reviewed. No pertinent surgical history.  Obstetrical History: OB History    Gravida  1   Para      Term      Preterm      AB      Living        SAB      TAB      Ectopic      Multiple      Live Births             Social History: Social History   Socioeconomic History  . Marital status: Single    Spouse name: Not on file  . Number of children: Not on file  . Years of education: Not on file  . Highest education level: Not on file  Occupational History  . Not on file  Tobacco Use  . Smoking status: Current Every Day Smoker    Types: Cigarettes  . Smokeless tobacco: Never Used  Substance and Sexual Activity  . Alcohol use: No  . Drug use: Not Currently    Types: Cocaine  . Sexual activity: Yes  Other Topics Concern  . Not on file  Social History Narrative  . Not on file   Social Determinants of Health   Financial Resource Strain:   . Difficulty of Paying Living Expenses:   Food Insecurity: Food Insecurity Present  . Worried About Charity fundraiser in the Last Year: Often true  . Ran Out of Food in the Last  Year: Often true  Transportation Needs: No Transportation Needs  . Lack of Transportation (Medical): No  . Lack of Transportation (Non-Medical): No  Physical Activity:   . Days of Exercise per Week:   . Minutes of Exercise per Session:   Stress:   . Feeling of Stress :   Social Connections:   . Frequency of Communication with Friends and Family:   . Frequency of Social Gatherings with Friends and Family:   . Attends Religious Services:   . Active Member of Clubs or Organizations:   . Attends Archivist Meetings:   Marland Kitchen Marital Status:     Family History: History reviewed. No pertinent family history.  Allergies: No Known Allergies  Medications Prior to Admission  Medication Sig Dispense Refill Last Dose  . ARIPiprazole (ABILIFY) 5 MG tablet Take 5 mg by mouth daily.   03/30/2020 at Unknown time  . benztropine (COGENTIN) 0.5 MG tablet Take 0.5 mg by mouth 2 (two) times daily.   03/30/2020 at Unknown time  . folic acid (FOLVITE) 0.5 MG tablet Take 1 mg by mouth daily.   03/30/2020 at Unknown time  .  Prenatal Vit-Fe Fumarate-FA (VITAFOL-OB) TABS Take 1 tablet by mouth daily. 90 tablet 1 03/30/2020 at Unknown time  . risperiDONE (RISPERDAL) 2 MG tablet Take 2 mg by mouth daily.    Past Week at Unknown time  . acetaminophen (TYLENOL) 500 MG tablet Take 500 mg by mouth every 6 (six) hours as needed for pain.   Unknown at Unknown time  . mupirocin ointment (BACTROBAN) 2 % Apply to skin bid 22 g 0 Unknown at Unknown time     Review of Systems   All systems reviewed and negative except as stated in HPI  Blood pressure 111/72, pulse 74, temperature 98.3 F (36.8 C), temperature source Oral, resp. rate 16, height 5\' 4"  (1.626 m), weight 100.1 kg, last menstrual period 12/02/2019. General appearance: alert, cooperative, appears stated age and moderate distress Lungs: normal effort Heart: regular rate  Abdomen: soft, non-tender; bowel sounds normal Pelvic: gravid  uterus Extremities: Homans sign is negative, no sign of DVT Presentation: unsure Fetal monitoringBaseline: 135 bpm, Variability: Good {> 6 bpm), Accelerations: Reactive and Decelerations: Absent Uterine activity: every 4 minutes  Dilation: 1 Effacement (%): 80 Station: -1 Exam by:: [email or web address], RN  Prenatal labs: ABO, Rh: A/Positive/-- (06/04 1129) Antibody: Negative (06/04 1129) Rubella: 2.07 (06/04 1129) RPR: Non Reactive (06/04 1129)  HBsAg: Negative (06/04 1129)  HIV: Non Reactive (06/04 1129)  GBS:   pending  2 hr Glucola: A1c 4.9  Genetic screening  Late to prenatal care, est at 06-01-1978 [employer] low risk, normal AF   Prenatal Transfer Tool  Maternal Diabetes: No Genetic Screening: Normal Maternal Ultrasounds/Referrals: Normal Fetal Ultrasounds or other Referrals:  None Maternal Substance Abuse:  Yes:  Type: Smoker, Cocaine Significant Maternal Medications:  Meds include: Other: Abilify, Cogentin, Risperdal Significant Maternal Lab Results: Other: GBS pending   Results for orders placed or performed during the hospital encounter of 03/31/20 (from the past 24 hour(s))  CBC   Collection Time: 03/31/20  6:03 AM  Result Value Ref Range   WBC 13.6 (H) 4.0 - 10.5 K/uL   RBC 4.37 3.87 - 5.11 MIL/uL   Hemoglobin 13.6 12.0 - 15.0 g/dL   HCT 04/02/20 36 - 46 %   MCV 91.5 80.0 - 100.0 fL   MCH 31.1 26.0 - 34.0 pg   MCHC 34.0 30.0 - 36.0 g/dL   RDW 47.6 54.6 - 50.3 %   Platelets 238 150 - 400 K/uL   nRBC 0.0 0.0 - 0.2 %    Patient Active Problem List   Diagnosis Date Noted  . Labor and delivery, indication for care 03/31/2020  . Supervision of high risk pregnancy, antepartum 03/18/2020  . Schizophrenia (HCC)   . Bipolar 1 disorder (HCC)     Assessment/Plan:  Lindsay Perry is a 29 y.o. G1P0 at [redacted]w[redacted]d here for PPROM  #Labor: PPROM, patient would like to proceed with expectant management for now and consider augmentation once her mother arrives to discuss the plan   #Pain: Labor support without medications for now  #FWB: Category I ; # EFW: 2346g #ID:  GBS pending, will treat with PCN, GC and hep C drawn #MOF: planning for adoption, bottle #MOC: desires PP IUD #Circ:  N/A #Polysubstance use; patient reported use of cocaine and tobacco, offer nicotine patch, SW consult, UDS  #Late to Prenatal Care: established at [redacted] weeks GA #Plan for Adoption of Infant: social work consult to coordinate services  #Schizophrenia/BPD: Home meds include risperdal, abilify and cogentin, follows with behavior health, will continue  home meds as prescribed    Nicki Guadalajara, MD Family Medicine, PGY-1 03/31/2020, 7:20 AM  I saw and evaluated the patient. I agree with the findings and the plan of care as documented in the resident's note. Additions/edits in blue. Rupture occurred around 0450. UDS ordered. Patient would like expectant management and to decide further plan once her mom arrives; possibly contracting now. SW consulted. Anticipate SVD.   Jerilynn Birkenhead, MD [facility] Family Medicine Fellow, [facility] for [employer], [facility]

## 2020-03-31 NOTE — MAU Note (Signed)
Covid swab obtained without difficulty and pt tol well. No symptoms 

## 2020-03-31 NOTE — Procedures (Signed)
  Post-Placental IUD Insertion Procedure Note  Patient identified, informed consent signed prior to delivery, signed copy in chart, time out was performed.    Vaginal, labial and perineal areas thoroughly inspected for lacerations. Right labial and 2nd degree laceration identified - not hemostatic,repaired prior to insertion of IUD.  Liletta   - IUD grasped with sterile ring forceps. Fundus identified through abdominal wall using non-insertion hand. IUD inserted to fundus with ring forceps. IUD carefully released at the fundus and ring forceps gently removed from vagina.    Strings trimmed to the level of the introitus. Patient tolerated procedure well.  Lot # [id number] Expiration Date 09/2023  Patient given post procedure instructions and IUD care card with expiration date.  Patient is asked to keep IUD strings tucked in her vagina until her postpartum follow up visit in 4-6 weeks. Patient advised to abstain from sexual intercourse and pulling on strings before her follow-up visit. Patient verbalized an understanding of the plan of care and agrees.   Marlowe Alt, DO OB Fellow, Faculty Practice 03/31/2020 6:59 PM

## 2020-03-31 NOTE — Progress Notes (Addendum)
Patient ID: Lindsay Perry, female   DOB: [birth year], 29 y.o.   MRN: [id number]  Lindsay Perry is a 29 y.o. G1P0 at [redacted]w[redacted]d admitted for PPROM.  Subjective: Feeling some anxiety.  Discussed IOL methods and she is agreeable to Cytotec, but not Foley bulb right now.  Mom (legal guardian) present at bedside.  Mom requests social work consult.    Objective: BP 111/72   Pulse 74   Temp 98.3 F (36.8 C) (Oral)   Resp 16   Ht 5\' 4"  (1.626 m)   Wt 100.1 kg   LMP 12/02/2019   BMI 37.88 kg/m  No intake/output data recorded.  FHT:  FHR: 140 bpm, variability: moderate,  accelerations:  Present,  decelerations:  Absent UC:   Irregular  SVE:   2/90/-2/mid-position/soft  Labs: Lab Results  Component Value Date   WBC 13.6 (H) 03/31/2020   HGB 13.6 03/31/2020   HCT 40.0 03/31/2020   MCV 91.5 03/31/2020   PLT 238 03/31/2020    Assessment / Plan: 29 y.o. G1P0 at [redacted]w[redacted]d here for PPROM.  Labor: PPROM.  Bishop score 8.  Vaginal Cytotec x1 placed.  Discuss Foley bulb and she declines.  Betamethasone for fetal lung maturity.  Consented for PP IUD - Mom [redacted]w[redacted]d) signed, as she is legal guardian. Fetal Wellbeing:  Category I Pain Control:  Per patient request I/D:  GBS pending, treating with penicillin given prematurity.  GC and hep C pending. Anticipated MOD:  SVD Polysubstance use; patient reported use of cocaine and tobacco, offer nicotine patch, SW consult, UDS  Late to prenatal care: Established at [redacted] weeks GA, CSW consult Plan for adoption of infant: CSWconsult to coordinate services  Schizophrenia/BPD: Home meds include risperdal, abilify and cogentin, follows with behavior health, will continue home meds as prescribed  Unstable housing: CSW consult.  Mom would like to get her into a group home.  Will lose current housing in about 1 month.   Henryetta Corriveau Psychiatrist, MD PGY-2 Resident Family Medicine 03/31/2020, 11:23 AM

## 2020-03-31 NOTE — Progress Notes (Signed)
I attempted to see Lindsay Perry earlier in the day but she was resting.  When I returned, she was still resting, but I spent time with her mother and offered emotional support and prayer at her request.  Lindsay Perry, Bcc Pager, [phone] 4:27 PM

## 2020-03-31 NOTE — Progress Notes (Signed)
CSW met with patient at bedside to discuss consult for adoption and homelessness. Patient's legal guardian Cooper Render [phone]) met with CSW outside the door and reported that patient currently resides independently and she has got her lease extended to 05/14/20. Patient's legal guardian reported that she is trying to find a group home for patient because patient has not been taking mental health medications for the past 6 months to one year. Patient's legal guardian reported that patient's mental health medications are managed by [facility]. CSW agreed to provide patient's legal guardian with group home resources to follow up with to find appropriate desired housing for patient. Patient's legal guardian reported that patient receives SSI disability and has a payee [employer] [phone]). Patient's legal guardian reported that patient allowed her food stamps to lapse. CSW inquired about patient's adoption plan, Patient's legal guardian reported that they are working with [facility] Lattie Haw [phone]) on the adoption plan. Patient's legal guardian reported that they don't want the infant in the room with them. CSW acknowledged and validated patient's plan and agreed to notify staff. CSW informed patient's legal guardian that CSW would complete a psychosocial assessment after infant is delivered, patient's legal guardian verbalized understanding.   CSW met with patient at bedside and introduced self. CSW inquired about patient's adoption plan, patient confirmed that she is interested in giving infant up for adoption and does not want infant in the room. Patient reported that she does want to name infant. CSW acknowledged and validated patient's plan. CSW agreed to meet with patient after giving birth to complete psychosocial assessment. CSW inquired about any needs/concerns, patient reported none.   CSW provided patient's legal guardian with two meal vouchers.   RN aware  that patient does not want infant in the room, plan to take infant to the nursery.   CSW will provide patient's legal guardian with group home resources.   Abundio Miu, [place] Worker [facility] Cell#: [phone]

## 2020-04-01 ENCOUNTER — Ambulatory Visit: Payer: Medicaid Other | Attending: Obstetrics and Gynecology

## 2020-04-01 ENCOUNTER — Ambulatory Visit: Payer: Medicaid Other

## 2020-04-01 LAB — GC/CHLAMYDIA PROBE AMP (~~LOC~~) NOT AT ARMC
Chlamydia: POSITIVE — AB
Comment: NEGATIVE
Comment: NORMAL
Neisseria Gonorrhea: NEGATIVE

## 2020-04-01 MED ORDER — IBUPROFEN 600 MG PO TABS: 600.0000 mg | ORAL_TABLET | Freq: Four times a day (QID) | ORAL | 0 refills | Status: AC

## 2020-04-01 NOTE — Progress Notes (Signed)
CSW acknowledged consult for edinburgh score 13. CSW met with [name] and completed psychosocial assessment. CSW provided PMADs education and resources for mental health follow up during assessment.   CSW contacted [facility] to identify [name]'s medicaid worker to assist with group home placement. Staff member Tillie Rung reported that [name] has a team of workers and not a Public relations account executive. Staff agreed to send a message to team that legal guardian is interested in group home placement assistance for [name].   CSW followed up with [name] and legal guardian at bedside. CSW provided update about [place] [facility] team contacting [name] to assist with group home placement. CSW updated legal guardian that CSW has been unsuccessful with reaching [facility] staff member Lattie Haw. Legal guardian showed CSW an email from [place] that included contact information for [facility] staff member Colletta Maryland [phone]). CSW contacted [place] staff member Colletta Maryland) on speaker with [name] and legal guardian. Staff explained adoption process, [name] and legal guardian verbalized understanding and agreement to meet with staff to complete adoption paperwork. Staff agreed to meet at the hospital at [place], [place] [facility] Cell#: [phone]

## 2020-04-01 NOTE — Progress Notes (Signed)
I offered follow up support to  Lindsay Perry and her mother, [name].  She is very focused on finishing what needs to be done and going home.   Chaplain Dyanne Carrel, [facility] Pager, [phone] 3:11 PM    04/01/20 1500  Clinical Encounter Type  Visited With Patient and family together  Visit Type Follow-up

## 2020-04-01 NOTE — Plan of Care (Signed)
  Problem: Education: Goal: Knowledge of General Education information will improve Description: Including pain rating scale, medication(s)/side effects and non-pharmacologic comfort measures 04/01/2020 1641 by Pat Patrick, RN Outcome: Adequate for Discharge 04/01/2020 1640 by Pat Patrick, RN Outcome: Adequate for Discharge

## 2020-04-01 NOTE — Progress Notes (Signed)
Discharge   With Chrystie Nose  Guardian   Teaching complete   Social worker  Saw pt cleared to leave

## 2020-04-01 NOTE — Clinical Social Work Maternal (Signed)
CLINICAL SOCIAL WORK MATERNAL/CHILD NOTE  Patient Details  Name: Lindsay Perry MRN: [id number] Date of Birth: 02-Mar-1991  Date:  04/01/2020  Clinical Social Worker Initiating Note:  Abundio Miu, [place] Date/Time: Initiated:  04/01/20/1202     Child's Name:  Lindsay Perry   Biological Parents:  Mother   Need for Interpreter:  None   Reason for Referral:  Adoption, Competency/Guardianship , Current Substance Use/Substance Use During Pregnancy , Homelessness   Address:  [place] [address]    Phone number:  [phone] (home)   Legal Guardian's phone number   Additional phone number:   Household Members/Support Persons (HM/SP):       HM/SP Name Relationship DOB or Age  HM/SP -1        HM/SP -2        HM/SP -3        HM/SP -4        HM/SP -5        HM/SP -6        HM/SP -7        HM/SP -8          Natural Supports (not living in the home):  Parent   Professional Supports: Other (Comment) ([facility])   Employment: Disabled   Type of Work:     Education:  9 to 11 years (9th Grade)  Homebound arranged: No  Financial Resources:  [employer], SSI/Disability   Other Resources:      Cultural/Religious Considerations Which May Impact Care:    Strengths:      Psychotropic Medications:         Pediatrician:       Pediatrician List:   [place]      Pediatrician Fax Number:    Risk Factors/Current Problems:  Substance Use    Cognitive State:  Able to Concentrate , Alert , Linear Thinking    Mood/Affect:  Calm , Comfortable , Interested    CSW Assessment: CSW met with [name] and [name]'s legal guardian Cooper Render [phone]) at bedside to discuss substance use during pregnancy, adoption and to provide group home resources. CSW re-introduced self and explained reason for visit. [name] and [name]'s legal guardian  presented calm and remained engaged during assessment. [name] reported that she resides alone and receives SSI disability. CSW inquired about [name]'s support system, [name] reported that her mom/legal guardian is her only support.   CSW inquired about [name]'s mental health history. [name] reported that she was diagnosed with Bipolar Disorder and Schizophrenia at age 46. [name] reported no current symptoms and was not able recall what her symptoms have been like in the past. [name] reported that she takes Risperdal, Abilify, Cogentin, Lamictal and Clonazepam (as needed). [name] reported that [facility] manages her medications. CSW inquired about how [name] was feeling emotionally about giving her daughter up for adoption, [name] reported that she feels like she can't do it alone and infant will have a better family. [name] reported that she is ready for discharge. CSW spoke with [name] about the emotions that can come along with adoption and encouraged [name] to seek therapy through the adoption agency to process her experience. [name] replied okay. CSW inquired about how legal guardian was feeling emotionally about everything, legal guardian reported that she was feeling okay. [name] presented calm and did not demonstrate any  acute mental health signs/symptoms. CSW assessed for safety, [name] denied SI, HI and domestic violence.   CSW provided education regarding the baby blues period vs. perinatal mood disorders, discussed treatment and gave resources for mental health follow up if concerns arise.  CSW recommends self-evaluation during the postpartum time period using the New Mom Checklist from Postpartum Progress and encouraged [name] to contact a medical professional if symptoms are noted at any time.    CSW inquired about adoption plan. [name] reported that it would be a closed adoption and she does not feel she was coerced into placing infant for adoption. Legal guardian reported that she has not spoken with the adoption agency since earlier this week.  CSW agreed to make contact and update [name] and legal guardian. Legal guardian inquired about how infant was doing, CSW agreed to get an update and asked if [name] and legal guardian wanted to see a picture, legal guardian reported that it was up to [name], [name] reported yes. CSW agreed to get an update and picture for [name] and legal guardian.   CSW informed [name] and legal guardian about the hospital drug screen policy due to substance use during pregnancy. [name] reported that she just tried cocaine a few times and denied any other substance use. [name] acknowledged that she was not supposed to be doing cocaine and was unable to recall last use. CSW informed [name] and legal guardian that infant's UDS was positive for cocaine and a CPS report would be made. [name] and legal guardian denied any questions regarding the CPS report.   CSW provided legal guardian with group home resource and [facility] adult placement contact information as requested. Legal guardian reported that she had a hard time finding a place that had an opening. CSW encouraged legal guardian to contact [place] adult Research scientist (physical sciences) for assistance. Legal guardian asked if CSW would be assisting in the process. CSW explained that CSW would not be able to secure a group home placement for [name] and that resources are what are available. CSW agreed to seek additional resources for legal guardian, legal guardian appreciative. CSW acknowledged how this is an overwhelming experience for legal guardian and agreed to provide any additional resources found.   CSW contacted [facility] staff member Lattie Haw [phone]) and left a voicemail requesting a return phone call.   CSW awaiting return call from [facility] staff regarding infant's adoption.  CSW got an update on infant and picture. CSW provided update to [name] and legal guardian and showed them the picture.   CSW contacted [facility] CPS to make a CPS report due to  infant's positive UDS for cocaine. CSW awaiting return call to make the report.    CSW Plan/Description:  Sudden Infant Death Syndrome (SIDS) Education, Perinatal Mood and Anxiety Disorder (PMADs) Education, [place], Child Protective Service Report , Other Information/Referral to [employer], [place] 04/01/2020, 12:06 PM

## 2020-04-01 NOTE — Progress Notes (Signed)
CSW received return call from [place] and made CPS report for infant's positive UDS for cocaine.  CSW met with [name] and legal guardian along with [facility] staff member Cecilio Asper). Staff went over adoption paperwork and obtained signatures. CSW made a copy of adoption paperwork (relinquishment of minor for adoption by parent or guardian ad litem of the mother/father;; acceptance of relinquishment of minor for adoption by parent or guardian ad litem of the mother/father) and placed on infant's chart. CSW will follow up with [place] [facility] on Monday regarding infant's discharge plans.   Infant to discharge to [facility] agency staff when medically stable. [place] [facility] staff can be contacted to provide any medical consents for infant Cecilio Asper [phone]). [place] Staff can call and receive medical updates on infant.   CSW will continue to offer resources/supports while infant is admitted to the hospital.   Abundio Miu, [facility] Cell#: [phone]

## 2020-04-02 LAB — CULTURE, BETA STREP (GROUP B ONLY)

## 2020-04-04 ENCOUNTER — Encounter: Payer: Medicaid Other | Admitting: Obstetrics & Gynecology

## 2020-04-04 LAB — SURGICAL PATHOLOGY

## 2020-04-05 ENCOUNTER — Telehealth (INDEPENDENT_AMBULATORY_CARE_PROVIDER_SITE_OTHER): Payer: Medicaid Other

## 2020-04-05 DIAGNOSIS — A749 Chlamydial infection, unspecified: Secondary | ICD-10-CM

## 2020-04-05 MED ORDER — AZITHROMYCIN 250 MG PO TABS: 250.0000 mg | ORAL_TABLET | Freq: Once | ORAL | 0 refills | Status: AC

## 2020-04-05 NOTE — Telephone Encounter (Addendum)
-----   Message from Joselyn Arrow, MD sent at 04/02/2020  7:54 AM EDT ----- Hello,  Can we contact this patient and make sure she received treatment for Chlamydia? If not, can we send her treatment and recommend partner testing and TOC in 3-4 weeks?  Thanks! Chelsea    Called patient's legal guardian, no phone number for pt in the chart. Talked with pt's mother, who is also legal guardian. She states the pt does not have a phone currently. Positive chlamydia result given. I explained I will send a one time medication to the patient's house through her pharmacy and will send a letter with instructions and the results to the patient. Pt's mother states she will attempt to contact pt to give her these results and to encourage her to take the medication. Rx for azithromycin 1 g once sent to pt's preferred pharmacy per Donia Ast, NP. Called pharmacy, staff member states they will deliver this medication to patient. Letter sent, see communications.

## 2020-04-08 ENCOUNTER — Ambulatory Visit: Payer: Medicaid Other

## 2020-05-02 ENCOUNTER — Ambulatory Visit: Payer: Medicaid Other | Admitting: Nurse Practitioner

## 2020-05-05 ENCOUNTER — Other Ambulatory Visit: Payer: Self-pay | Admitting: Obstetrics & Gynecology

## 2020-06-08 ENCOUNTER — Emergency Department (HOSPITAL_COMMUNITY)
Admission: EM | Admit: 2020-06-08 | Discharge: 2020-06-08 | Discharge status: Against Medical Advice | Payer: Medicaid Other

## 2020-06-08 NOTE — ED Triage Notes (Signed)
Pt decided to leave prior to being triaged, states her mom is taking her to another hospital.

## 2020-06-24 ENCOUNTER — Emergency Department (HOSPITAL_COMMUNITY)
Admission: EM | Admit: 2020-06-24 | Discharge: 2020-06-24 | Discharge status: Against Medical Advice | Payer: Medicaid Other | Attending: Emergency Medicine | Admitting: Emergency Medicine

## 2020-06-24 ENCOUNTER — Other Ambulatory Visit: Payer: Self-pay

## 2020-06-24 ENCOUNTER — Encounter (HOSPITAL_COMMUNITY): Payer: Self-pay | Admitting: *Deleted

## 2020-06-24 DIAGNOSIS — R45851 Suicidal ideations: Secondary | ICD-10-CM | POA: Insufficient documentation

## 2020-06-24 DIAGNOSIS — F17210 Nicotine dependence, cigarettes, uncomplicated: Secondary | ICD-10-CM | POA: Diagnosis not present

## 2020-06-24 DIAGNOSIS — F99 Mental disorder, not otherwise specified: Secondary | ICD-10-CM | POA: Diagnosis present

## 2020-06-24 DIAGNOSIS — Z20822 Contact with and (suspected) exposure to covid-19: Secondary | ICD-10-CM | POA: Diagnosis not present

## 2020-06-24 DIAGNOSIS — Z79899 Other long term (current) drug therapy: Secondary | ICD-10-CM | POA: Diagnosis not present

## 2020-06-24 DIAGNOSIS — F1491 Cocaine use, unspecified, in remission: Secondary | ICD-10-CM

## 2020-06-24 DIAGNOSIS — F319 Bipolar disorder, unspecified: Secondary | ICD-10-CM | POA: Diagnosis present

## 2020-06-24 LAB — COMPREHENSIVE METABOLIC PANEL
ALT: 30 U/L (ref 0–44)
AST: 22 U/L (ref 15–41)
Albumin: 4 g/dL (ref 3.5–5.0)
Alkaline Phosphatase: 61 U/L (ref 38–126)
Anion gap: 10 (ref 5–15)
BUN: 17 mg/dL (ref 6–20)
CO2: 27 mmol/L (ref 22–32)
Calcium: 9.5 mg/dL (ref 8.9–10.3)
Chloride: 102 mmol/L (ref 98–111)
Creatinine, Ser: 0.79 mg/dL (ref 0.44–1.00)
GFR calc Af Amer: >60 mL/min (ref >60)
GFR calc non Af Amer: >60 mL/min (ref >60)
Glucose, Bld: 91 mg/dL (ref 70–99)
Potassium: 4.4 mmol/L (ref 3.5–5.1)
Sodium: 139 mmol/L (ref 135–145)
Total Bilirubin: 0.5 mg/dL (ref 0.3–1.2)
Total Protein: 7.1 g/dL (ref 6.5–8.1)

## 2020-06-24 LAB — CBC
HCT: 49.9 % — ABNORMAL HIGH (ref 36.0–46.0)
Hemoglobin: 16.3 g/dL — ABNORMAL HIGH (ref 12.0–15.0)
MCH: 30.8 pg (ref 26.0–34.0)
MCHC: 32.7 g/dL (ref 30.0–36.0)
MCV: 94.2 fL (ref 80.0–100.0)
Platelets: 323 10*3/uL (ref 150–400)
RBC: 5.3 MIL/uL — ABNORMAL HIGH (ref 3.87–5.11)
RDW: 12.4 % (ref 11.5–15.5)
WBC: 11 10*3/uL — ABNORMAL HIGH (ref 4.0–10.5)
nRBC: 0 % (ref 0.0–0.2)

## 2020-06-24 LAB — SARS CORONAVIRUS 2 BY RT PCR (HOSPITAL ORDER, PERFORMED IN ~~LOC~~ HOSPITAL LAB): SARS Coronavirus 2: NEGATIVE

## 2020-06-24 LAB — RAPID URINE DRUG SCREEN, HOSP PERFORMED
Amphetamines: NOT DETECTED
Barbiturates: NOT DETECTED
Benzodiazepines: NOT DETECTED
Cocaine: POSITIVE — AB
Opiates: NOT DETECTED
Tetrahydrocannabinol: NOT DETECTED

## 2020-06-24 LAB — ETHANOL: Alcohol, Ethyl (B): <10 mg/dL (ref <10)

## 2020-06-24 LAB — ACETAMINOPHEN LEVEL: Acetaminophen (Tylenol), Serum: <10 ug/mL — ABNORMAL LOW (ref 10–30)

## 2020-06-24 LAB — SALICYLATE LEVEL: Salicylate Lvl: <7 mg/dL — ABNORMAL LOW (ref 7.0–30.0)

## 2020-06-24 LAB — PREGNANCY, URINE: Preg Test, Ur: NEGATIVE

## 2020-06-24 NOTE — ED Notes (Signed)
Attempted to contact patients legal guardian, [name], only rec'd voicemail.

## 2020-06-24 NOTE — Consult Note (Signed)
[facility] Psych ED Progress Note  06/24/2020 10:55 AM Lindsay Perry  MRN:  [id number] Subjective: Patient states "I only came here because I was locked out of Richard's house last night, as state out late and then he was at home when I got back."  Patient assessed by nurse practitioner.  Patient alert and oriented, participates in assessment.  Patient denies suicidal and homicidal ideations.  Patient denies history of suicide attempts, denies self-harm behaviors.  Patient denies auditory visual hallucinations.  Patient does not appear to be responding to internal stimuli and there is no evidence of delusional thought content.  Patient denies symptoms of paranoia.  Patient reports she resides in [place] with a friend named Richard.  Patient reports she was evicted from her apartment after smoking cigarettes 1 cigarette smoking is not allowed in this apartment.  Patient reports her mother is attempting to find group home placement for her but agrees with plan to reside with friend until group home placement can be secured.  Patient denies access to weapons.  Patient reports she is currently not employed.  Patient denies alcohol use.  Patient reports crack cocaine use.  Patient endorses intermittent use and current readiness to stop using.  Patient reports she is followed by outpatient psychiatrist.  Outpatient psychiatrist, Dr. Jodi Mourning.  Patient reports noncompliance with psychotropic medications times approximately 1 month as she did not have her medications.  Patient reports her medications are ready for pickup at [facility] and she plans to pick up medications today including Risperdal and Cogentin.  Patient offered support and encouragement.  Patient gives verbal consent to speak with her mother.  TTS counselor attempted to telephone patient's mother.  Per emergency department staff patient eloped from ED insisting that she should be allowed to smoke cigarettes on hospital property.   Principal Problem:  History of cocaine use Diagnosis:  Principal Problem:   History of cocaine use Active Problems:   Bipolar 1 disorder (HCC)  Total Time spent with patient: 30 minutes  Past Psychiatric History:   Past Medical History:  Past Medical History:  Diagnosis Date  . Asthma   . Bipolar 1 disorder (HCC)   . Schizophrenia (HCC)    History reviewed. No pertinent surgical history. Family History: No family history on file. Family Psychiatric  History: None reported Social History:  Social History   Substance and Sexual Activity  Alcohol Use No     Social History   Substance and Sexual Activity  Drug Use Not Currently  . Types: Cocaine    Social History   Socioeconomic History  . Marital status: Single    Spouse name: Not on file  . Number of children: Not on file  . Years of education: Not on file  . Highest education level: Not on file  Occupational History  . Not on file  Tobacco Use  . Smoking status: Current Every Day Smoker    Types: Cigarettes  . Smokeless tobacco: Never Used  Substance and Sexual Activity  . Alcohol use: No  . Drug use: Not Currently    Types: Cocaine  . Sexual activity: Yes  Other Topics Concern  . Not on file  Social History Narrative  . Not on file   Social Determinants of Health   Financial Resource Strain:   . Difficulty of Paying Living Expenses: Not on file  Food Insecurity: Food Insecurity Present  . Worried About Programme researcher, broadcasting/film/video in the Last Year: Often true  . Ran Out of Food  in the Last Year: Often true  Transportation Needs: No Transportation Needs  . Lack of Transportation (Medical): No  . Lack of Transportation (Non-Medical): No  Physical Activity:   . Days of Exercise per Week: Not on file  . Minutes of Exercise per Session: Not on file  Stress:   . Feeling of Stress : Not on file  Social Connections:   . Frequency of Communication with Friends and Family: Not on file  . Frequency of Social Gatherings with Friends  and Family: Not on file  . Attends Religious Services: Not on file  . Active Member of Clubs or Organizations: Not on file  . Attends Banker Meetings: Not on file  . Marital Status: Not on file    Sleep: Good  Appetite:  Good  Current Medications: No current facility-administered medications for this encounter.   Current Outpatient Medications  Medication Sig Dispense Refill  . ARIPiprazole (ABILIFY) 5 MG tablet Take 5 mg by mouth daily.    . benztropine (COGENTIN) 0.5 MG tablet Take 0.5 mg by mouth 2 (two) times daily.    Marland Kitchen ibuprofen (ADVIL) 600 MG tablet Take 1 tablet (600 mg total) by mouth every 6 (six) hours. 30 tablet 0  . Prenatal Vit-Fe Fumarate-FA (VITAFOL-OB) TABS Take 1 tablet by mouth daily. 90 tablet 1  . risperiDONE (RISPERDAL) 2 MG tablet Take 2 mg by mouth daily.     . Prenat-Fe Poly-Methfol-FA-DHA (VITAFOL FE+) 90-0.6-0.4-200 MG CAPS TAKE ONE CAPSULE BY MOUTH DAILY 30 capsule 6    Lab Results:  Results for orders placed or performed during the hospital encounter of 06/24/20 (from the past 48 hour(s))  Comprehensive metabolic panel     Status: None   Collection Time: 06/24/20  5:11 AM  Result Value Ref Range   Sodium 139 135 - 145 mmol/L   Potassium 4.4 3.5 - 5.1 mmol/L   Chloride 102 98 - 111 mmol/L   CO2 27 22 - 32 mmol/L   Glucose, Bld 91 70 - 99 mg/dL    Comment: Glucose reference range applies only to samples taken after fasting for at least 8 hours.   BUN 17 6 - 20 mg/dL   Creatinine, Ser 4.09 0.44 - 1.00 mg/dL   Calcium 9.5 8.9 - 81.1 mg/dL   Total Protein 7.1 6.5 - 8.1 g/dL   Albumin 4.0 3.5 - 5.0 g/dL   AST 22 15 - 41 U/L   ALT 30 0 - 44 U/L   Alkaline Phosphatase 61 38 - 126 U/L   Total Bilirubin 0.5 0.3 - 1.2 mg/dL   GFR calc non Af Amer >60 >60 mL/min   GFR calc Af Amer >60 >60 mL/min   Anion gap 10 5 - 15    Comment: Performed at [facility], [address] [place], [place] [address]  Ethanol     Status:  None   Collection Time: 06/24/20  5:11 AM  Result Value Ref Range   Alcohol, Ethyl (B) <10 <10 mg/dL    Comment: (NOTE) Lowest detectable limit for serum alcohol is 10 mg/dL.  For medical purposes only. Performed at [facility], [address], [place], [place] [address]   Salicylate level     Status: Abnormal   Collection Time: 06/24/20  5:11 AM  Result Value Ref Range   Salicylate Lvl <7.0 (L) 7.0 - 30.0 mg/dL    Comment: Performed at [facility], [address], [place], [place] [address]  Acetaminophen level     Status: Abnormal   Collection Time: 06/24/20  5:11 AM  Result Value Ref Range   Acetaminophen (Tylenol), Serum <10 (L) 10 - 30 ug/mL    Comment: (NOTE) Therapeutic concentrations vary significantly. A range of 10-30 ug/mL  may be an effective concentration for many patients. However, some  are best treated at concentrations outside of this range. Acetaminophen concentrations >150 ug/mL at 4 hours after ingestion  and >50 ug/mL at 12 hours after ingestion are often associated with  toxic reactions.  Performed at [facility], [address], [place], [place] [address]   cbc     Status: Abnormal   Collection Time: 06/24/20  5:11 AM  Result Value Ref Range   WBC 11.0 (H) 4.0 - 10.5 K/uL   RBC 5.30 (H) 3.87 - 5.11 MIL/uL   Hemoglobin 16.3 (H) 12.0 - 15.0 g/dL   HCT 30.8 (H) 36 - 46 %   MCV 94.2 80.0 - 100.0 fL   MCH 30.8 26.0 - 34.0 pg   MCHC 32.7 30.0 - 36.0 g/dL   RDW 65.7 84.6 - 96.2 %   Platelets 323 150 - 400 K/uL   nRBC 0.0 0.0 - 0.2 %    Comment: Performed at [facility], [address], [place], [place] [address]  Rapid urine drug screen (hospital performed)     Status: Abnormal   Collection Time: 06/24/20  5:11 AM  Result Value Ref Range   Opiates NONE DETECTED NONE DETECTED   Cocaine POSITIVE (A) NONE DETECTED   Benzodiazepines NONE DETECTED NONE DETECTED   Amphetamines  NONE DETECTED NONE DETECTED   Tetrahydrocannabinol NONE DETECTED NONE DETECTED   Barbiturates NONE DETECTED NONE DETECTED    Comment: (NOTE) DRUG SCREEN FOR MEDICAL PURPOSES ONLY.  IF CONFIRMATION IS NEEDED FOR ANY PURPOSE, NOTIFY LAB WITHIN 5 DAYS.  LOWEST DETECTABLE LIMITS FOR URINE DRUG SCREEN Drug Class                     Cutoff (ng/mL) Amphetamine and metabolites    1000 Barbiturate and metabolites    200 Benzodiazepine                 200 Tricyclics and metabolites     300 Opiates and metabolites        300 Cocaine and metabolites        300 THC                            50 Performed at [facility], [address] [place], [place] [address]   Pregnancy, urine     Status: None   Collection Time: 06/24/20  5:11 AM  Result Value Ref Range   Preg Test, Ur NEGATIVE NEGATIVE    Comment:        THE SENSITIVITY OF THIS METHODOLOGY IS >20 mIU/mL. Performed at [facility], [address], [place], [place] [address]   SARS Coronavirus 2 by RT PCR (hospital order, performed in [facility] hospital lab) Nasopharyngeal Nasopharyngeal Swab     Status: None   Collection Time: 06/24/20  5:20 AM   Specimen: Nasopharyngeal Swab  Result Value Ref Range   SARS Coronavirus 2 NEGATIVE NEGATIVE    Comment: (NOTE) SARS-CoV-2 target nucleic acids are NOT DETECTED.  The SARS-CoV-2 RNA is generally detectable in upper and lower respiratory specimens during the acute phase of  infection. The lowest concentration of SARS-CoV-2 viral copies this assay can detect is 250 copies / mL. A negative result does not preclude SARS-CoV-2 infection and should not be used as the sole basis for treatment or other patient management decisions.  A negative result may occur with improper specimen collection / handling, submission of specimen other than nasopharyngeal swab, presence of viral mutation(s) within the areas targeted by this assay, and inadequate number of  viral copies (<250 copies / mL). A negative result must be combined with clinical observations, patient history, and epidemiological information.  Fact Sheet for Patients:   BoilerBrush.com.cy  Fact Sheet for Healthcare Providers: https://pope.com/  This test is not yet approved or  cleared by the [place] FDA and has been authorized for detection and/or diagnosis of SARS-CoV-2 by FDA under an Emergency Use Authorization (EUA).  This EUA will remain in effect (meaning this test can be used) for the duration of the COVID-19 declaration under Section 564(b)(1) of the Act, 21 U.S.C. section 360bbb-3(b)(1), unless the authorization is terminated or revoked sooner.  Performed at [facility], [address], [place], [place] [address]     Blood Alcohol level:  Lab Results  Component Value Date   ETH <10 06/24/2020   ETH <10 01/12/2020    Physical Findings: AIMS:  , ,  ,  ,    CIWA:    COWS:     Musculoskeletal: Strength & Muscle Tone: within normal limits Gait & Station: normal Patient leans: N/A  Psychiatric Specialty Exam: Physical Exam Vitals and nursing note reviewed.  Constitutional:      Appearance: She is well-developed.  HENT:     Head: Normocephalic.  Cardiovascular:     Rate and Rhythm: Normal rate.  Pulmonary:     Effort: Pulmonary effort is normal.  Neurological:     Mental Status: She is alert and oriented to person, place, and time.  Psychiatric:        Attention and Perception: Attention and perception normal.        Mood and Affect: Mood and affect normal.        Speech: Speech normal.        Behavior: Behavior normal. Behavior is cooperative.        Thought Content: Thought content normal.        Cognition and Memory: Cognition and memory normal.        Judgment: Judgment normal.     Review of Systems  Constitutional: Negative.   HENT: Negative.   Eyes: Negative.    Respiratory: Negative.   Cardiovascular: Negative.   Gastrointestinal: Negative.   Genitourinary: Negative.   Musculoskeletal: Negative.   Skin: Negative.   Neurological: Negative.   Psychiatric/Behavioral: Negative.     Blood pressure 124/88, pulse 75, temperature 97.9 F (36.6 C), temperature source Oral, resp. rate 20, last menstrual period 06/05/2020, SpO2 99 %, unknown if currently breastfeeding.There is no height or weight on file to calculate BMI.  General Appearance: Casual  Eye Contact:  Good  Speech:  Clear and Coherent and Normal Rate  Volume:  Normal  Mood:  Euthymic  Affect:  Appropriate and Congruent  Thought Process:  Coherent, Goal Directed and Descriptions of Associations: Intact  Orientation:  Full (Time, Place, and Person)  Thought Content:  WDL and Logical  Suicidal Thoughts:  No  Homicidal Thoughts:  No  Memory:  Immediate;   Fair Recent;   Fair Remote;   Fair  Judgement:  Fair  Insight:  Fair  Psychomotor Activity:  Normal  Concentration:  Concentration: Fair and Attention Span: Fair  Recall:  Good  Fund of Knowledge:  Good  Language:  Good  Akathisia:  No  Handed:  Right  AIMS (if indicated):     Assets:  Communication Skills Desire for Improvement Financial Resources/Insurance Housing Intimacy Leisure Time Physical Health Resilience Social Support  ADL's:  Intact  Cognition:  WNL  Sleep:         Treatment Plan Summary: Patient reviewed with Dr. Nelly RoutArchana Kumar.  Recommend follow-up with established outpatient psychiatry. Patient cleared by psychiatry.  Patrcia Dollyina L Tate, FNP 06/24/2020, 10:55 AM

## 2020-06-24 NOTE — ED Notes (Signed)
Pt visualized leaving ED.  EDP [place] made aware.

## 2020-06-24 NOTE — ED Notes (Signed)
Pt laying in hall bed, dressed out in purple scrubs.  Pt resting calmly, cooperative at this time.  Will continue to monitor.

## 2020-06-24 NOTE — ED Provider Notes (Signed)
Pt presented voluntarily. She expressed SI but does not appear to have been IVC'd. TTS evaluated her but she eloped immediately after this. They planned for outpt FU anyway. Mother to be notified.    Raeford Razor, MD 06/26/20 1356

## 2020-06-24 NOTE — ED Notes (Addendum)
Pt completed TTS assessment.  Upon completion of TTS consultation, pt expressing desire to leave, asking how to get out of department, asking for her clothes.  Pt remains calm while making these requests. Belongings returned to pt. Pt encouraged to stay until psychiatric team has completed their work with her.  Pt demanding to leave, ambulatory with steady gait.

## 2020-06-24 NOTE — ED Triage Notes (Signed)
Pt ambulatory to triage, reports that she has been off her medications for about 3 months, feels she might hurt herself if she does not get back on her medications. No specific SI plan.

## 2020-06-24 NOTE — BH Assessment (Signed)
[facility] Assessment Progress Note  Per Berneice Heinrich, NP, this voluntary pt does not require psychiatric hospitalization.  Pt is psychiatrically cleared, but eloped from [facility] before discharge instructions could be entered.  Pollyann Glen, LCAS reports that he has attempted to reach pt's guardian, Cristobal Goldmann [phone]), but no one picks up and voice mail is full.  Doylene Canning, [place] [facility] Coordinator [phone]

## 2020-06-24 NOTE — ED Provider Notes (Signed)
[facility]-EMERGENCY DEPT Provider Note   CSN: [id number] Arrival date & time: 06/24/20  0454     History No chief complaint on file.   Lindsay Perry is a 29 y.o. female.  Patient to ED stating she feels like she is going to hurt herself. She denies self harm prior to arrival. She denies HI or AVH. She reports being off her medications for the past 3 months and trying to live in an apartment without success. She states her mom has been trying to find a group home to place her in but has not found a place for her to go.   The history is provided by the patient. No language interpreter was used.       Past Medical History:  Diagnosis Date  . Asthma   . Bipolar 1 disorder (HCC)   . Schizophrenia [facility])     Patient Active Problem List   Diagnosis Date Noted  . Labor and delivery, indication for care 03/31/2020  . History of cocaine use 03/31/2020  . Encounter for initial insertion of intrauterine contraceptive device   . Supervision of high risk pregnancy, antepartum 03/18/2020  . Schizophrenia (HCC)   . Bipolar 1 disorder (HCC)     History reviewed. No pertinent surgical history.   OB History    Gravida  1   Para  1   Term      Preterm  1   AB      Living  1     SAB      TAB      Ectopic      Multiple  0   Live Births  1           No family history on file.  Social History   Tobacco Use  . Smoking status: Current Every Day Smoker    Types: Cigarettes  . Smokeless tobacco: Never Used  Substance Use Topics  . Alcohol use: No  . Drug use: Not Currently    Types: Cocaine    Home Medications Prior to Admission medications   Medication Sig Start Date End Date Taking? Authorizing Provider  ARIPiprazole (ABILIFY) 5 MG tablet Take 5 mg by mouth daily.   Yes [provider]  benztropine (COGENTIN) 0.5 MG tablet Take 0.5 mg by mouth 2 (two) times daily.   Yes [provider]  ibuprofen (ADVIL) 600 MG  tablet Take 1 tablet (600 mg total) by mouth every 6 (six) hours. 04/01/20  Yes Adam Phenix, MD  Prenatal Vit-Fe Fumarate-FA (VITAFOL-OB) TABS Take 1 tablet by mouth daily. 03/21/20  Yes Malachy Chamber, MD  risperiDONE (RISPERDAL) 2 MG tablet Take 2 mg by mouth daily.    Yes [provider]  Prenat-Fe Poly-Methfol-FA-DHA (VITAFOL FE+) 90-0.6-0.4-200 MG CAPS TAKE ONE CAPSULE BY MOUTH DAILY 05/17/20   Warden Fillers, MD    Allergies    Patient has no known allergies.  Review of Systems   Review of Systems  Constitutional: Negative for chills and fever.  HENT: Negative.   Respiratory: Negative.   Cardiovascular: Negative.   Gastrointestinal: Negative.   Musculoskeletal: Negative.   Skin: Negative.   Neurological: Negative.   Psychiatric/Behavioral: Positive for suicidal ideas.    Physical Exam Updated Vital Signs BP 124/88   Pulse 75   Temp 97.9 F (36.6 C) (Oral)   Resp 20   LMP 06/05/2020   SpO2 99%   Physical Exam Vitals and nursing note reviewed.  Constitutional:  Appearance: She is well-developed.  HENT:     Head: Normocephalic.  Cardiovascular:     Rate and Rhythm: Normal rate and regular rhythm.  Pulmonary:     Effort: Pulmonary effort is normal.     Breath sounds: Normal breath sounds.  Abdominal:     General: Bowel sounds are normal.     Palpations: Abdomen is soft.     Tenderness: There is no abdominal tenderness. There is no guarding or rebound.  Musculoskeletal:        General: Normal range of motion.     Cervical back: Normal range of motion and neck supple.  Skin:    General: Skin is warm and dry.     Findings: No rash.  Neurological:     Mental Status: She is alert.     Cranial Nerves: No cranial nerve deficit.     ED Results / Procedures / Treatments   Labs (all labs ordered are listed, but only abnormal results are displayed) Labs Reviewed  SARS CORONAVIRUS 2 BY RT PCR (HOSPITAL ORDER, PERFORMED IN [facility]  LAB)  COMPREHENSIVE METABOLIC PANEL  ETHANOL  SALICYLATE LEVEL  ACETAMINOPHEN LEVEL  CBC  RAPID URINE DRUG SCREEN, HOSP PERFORMED  PREGNANCY, URINE    EKG None  Radiology No results found.  Procedures Procedures (including critical care time)  Medications Ordered in ED Medications - No data to display  ED Course  I have reviewed the triage vital signs and the nursing notes.  Pertinent labs & imaging results that were available during my care of the patient were reviewed by me and considered in my medical decision making (see chart for details).    MDM Rules/Calculators/A&P                          Patient to ED stating SI. No HI/AVH. Off medications for several months and feels she needs stabilization.   Feeling physically well. VSS. Cooperative. Here voluntarily. TTS requested to determine appropriate disposition.   Final Clinical Impression(s) / ED Diagnoses Final diagnoses:  None   1. SI  Rx / DC Orders ED Discharge Orders    None       Elpidio Anis, PA-C 06/24/20 0558    Gilda Crease, MD 06/24/20 [phone]

## 2020-06-24 NOTE — ED Notes (Signed)
PT belongings: one white tank, one blk shorts, blk/white slides

## 2020-06-24 NOTE — BH Assessment (Signed)
[facility] Assessment Progress Note This Probation officer met with patient this date with Hall Busing NP to evlaute current mental health status. Patient denies any S/I, H/I or AVH. Patient was noted to be alert and oriented denying any previous attempts or gestures at self harm. Patient renders a somewhat conflicting history although states she was asked to leave her apartment due to "smoking cigarettes inside which is against the rules" and was ask to leave. Patient states she is temporally residing with a friend "Lindsay Perry" and will return to that residence when discharged. Patient states her mother Lindsay Perry [phone] is assisting with finding her another residence. Patient denies that her mother is her legal guardian although per notes it is indicated that she is. This writer  attempted to clarify although there was no one at that number. Patient's UDS was positive for cocaine this date and states she uses about two to three times a week. Hall Busing NP after evaluating patient recommended patient be discharged later this date. This Probation officer returned to further assess patient at 1115 to gather additional information to complete TTS assessment and found patient had left AMA. Due to this writer not being able to obtain that information a TTS assessment was unable to be completed due to lack of history that was needed to be provided by patient.

## 2020-06-24 NOTE — ED Notes (Signed)
Pt sitting in chair, given food/drink.

## 2021-01-16 ENCOUNTER — Other Ambulatory Visit: Payer: Self-pay

## 2021-01-16 ENCOUNTER — Emergency Department (HOSPITAL_BASED_OUTPATIENT_CLINIC_OR_DEPARTMENT_OTHER)
Admission: EM | Admit: 2021-01-16 | Discharge: 2021-01-16 | Disposition: A | Discharge status: Home / Self Care | Payer: Medicaid Other | Attending: Emergency Medicine | Admitting: Emergency Medicine

## 2021-01-16 ENCOUNTER — Encounter (HOSPITAL_BASED_OUTPATIENT_CLINIC_OR_DEPARTMENT_OTHER): Payer: Self-pay | Admitting: *Deleted

## 2021-01-16 DIAGNOSIS — Z87891 Personal history of nicotine dependence: Secondary | ICD-10-CM | POA: Insufficient documentation

## 2021-01-16 DIAGNOSIS — J45909 Unspecified asthma, uncomplicated: Secondary | ICD-10-CM | POA: Insufficient documentation

## 2021-01-16 DIAGNOSIS — R42 Dizziness and giddiness: Secondary | ICD-10-CM | POA: Diagnosis present

## 2021-01-16 LAB — HCG, QUANTITATIVE, PREGNANCY: hCG, Beta Chain, Quant, S: <1 m[IU]/mL (ref <5)

## 2021-01-16 LAB — URINALYSIS, ROUTINE W REFLEX MICROSCOPIC
Bilirubin Urine: NEGATIVE
Glucose, UA: NEGATIVE mg/dL
Hgb urine dipstick: NEGATIVE
Ketones, ur: NEGATIVE mg/dL
Leukocytes,Ua: NEGATIVE
Nitrite: NEGATIVE
Protein, ur: NEGATIVE mg/dL
Specific Gravity, Urine: 1.013 (ref 1.005–1.030)
pH: 6 (ref 5.0–8.0)

## 2021-01-16 LAB — CBC
HCT: 44.5 % (ref 36.0–46.0)
Hemoglobin: 14.8 g/dL (ref 12.0–15.0)
MCH: 30.4 pg (ref 26.0–34.0)
MCHC: 33.3 g/dL (ref 30.0–36.0)
MCV: 91.4 fL (ref 80.0–100.0)
Platelets: 255 10*3/uL (ref 150–400)
RBC: 4.87 MIL/uL (ref 3.87–5.11)
RDW: 12.3 % (ref 11.5–15.5)
WBC: 9.3 10*3/uL (ref 4.0–10.5)
nRBC: 0 % (ref 0.0–0.2)

## 2021-01-16 LAB — BASIC METABOLIC PANEL
Anion gap: 6 (ref 5–15)
BUN: 6 mg/dL (ref 6–20)
CO2: 30 mmol/L (ref 22–32)
Calcium: 9.1 mg/dL (ref 8.9–10.3)
Chloride: 104 mmol/L (ref 98–111)
Creatinine, Ser: 0.69 mg/dL (ref 0.44–1.00)
GFR, Estimated: >60 mL/min (ref >60)
Glucose, Bld: 95 mg/dL (ref 70–99)
Potassium: 3.7 mmol/L (ref 3.5–5.1)
Sodium: 140 mmol/L (ref 135–145)

## 2021-01-16 NOTE — ED Notes (Signed)
Patient stated that she wants to be placed in a group home.  She was in the group home before and was discharged d/t insurance.  Pt is currently homeless.  EDP is aware and ordered SW for resources.

## 2021-01-16 NOTE — ED Notes (Signed)
Patient told staff that she did not want to keep her clothes. Staff did not take clothing from patient but informed her she can throw them away if she desired. White shorts and yellow shirt found discarded in trash.

## 2021-01-16 NOTE — Discharge Instructions (Addendum)
Follow-up with your primary doctor.  Return to ER if you develop any chest pain, difficulty breathing or other new concerning symptom.

## 2021-01-16 NOTE — ED Notes (Addendum)
Pt left without informing staff. IV still in pt's arm, prior to leaving. Security and [employer] informed that pt left with IV in right AC. Pt found walking away and was asked by [employer] and Security to return to the ED to have IV removed by staff. IV taken out and pt stated that her father was picking her up so she was leaving.

## 2021-01-16 NOTE — ED Provider Notes (Signed)
[facility] EMERGENCY DEPT Provider Note   CSN: [id number] Arrival date & time: 01/16/21  1523     History Chief Complaint  Patient presents with  . Panic Attack  . Dizziness    Lindsay Perry is a 30 y.o. female.  Presents to ER with concern for dizziness/lightheadedness.  Patient states that she feels generally unwell.  Has been feeling this way for the past month or so.  No room spinning sensation.  No change with position.  No alleviating or aggravating factors.  Patient denies any thoughts of hurting herself or hurting others.  No auditory or visual hallucinations.  States mother is legal guardian.  HPI     Past Medical History:  Diagnosis Date  . Asthma   . Bipolar 1 disorder (HCC)   . Schizophrenia [facility])     Patient Active Problem List   Diagnosis Date Noted  . Labor and delivery, indication for care 03/31/2020  . History of cocaine use 03/31/2020  . Encounter for initial insertion of intrauterine contraceptive device   . Supervision of high risk pregnancy, antepartum 03/18/2020  . Schizophrenia (HCC)   . Bipolar 1 disorder (HCC)     History reviewed. No pertinent surgical history.   OB History    Gravida  1   Para  1   Term      Preterm  1   AB      Living  1     SAB      IAB      Ectopic      Multiple  0   Live Births  1           No family history on file.  Social History   Tobacco Use  . Smoking status: Former Smoker    Types: Cigarettes  . Smokeless tobacco: Never Used  Vaping Use  . Vaping Use: Some days  Substance Use Topics  . Alcohol use: No  . Drug use: Not Currently    Types: Cocaine, Marijuana    Home Medications Prior to Admission medications   Medication Sig Start Date End Date Taking? Authorizing Provider  ARIPiprazole (ABILIFY) 5 MG tablet Take 5 mg by mouth daily.    [provider]  benztropine (COGENTIN) 0.5 MG tablet Take 0.5 mg by mouth 2 (two) times daily.    [provider]  ibuprofen (ADVIL) 600 MG tablet Take 1 tablet (600 mg total) by mouth every 6 (six) hours. 04/01/20   Adam Phenix, MD  Prenat-Fe Poly-Methfol-FA-DHA (VITAFOL FE+) 90-0.6-0.4-200 MG CAPS TAKE ONE CAPSULE BY MOUTH DAILY 05/17/20   Warden Fillers, MD  Prenatal Vit-Fe Fumarate-FA (VITAFOL-OB) TABS Take 1 tablet by mouth daily. 03/21/20   Malachy Chamber, MD  risperiDONE (RISPERDAL) 2 MG tablet Take 2 mg by mouth daily.     [provider]    Allergies    Cantaloupe (diagnostic)  Review of Systems   Review of Systems  Constitutional: Negative for chills and fever.  HENT: Negative for ear pain and sore throat.   Eyes: Negative for pain and visual disturbance.  Respiratory: Negative for cough and shortness of breath.   Cardiovascular: Negative for chest pain and palpitations.  Gastrointestinal: Negative for abdominal pain and vomiting.  Genitourinary: Negative for dysuria and hematuria.  Musculoskeletal: Negative for arthralgias and back pain.  Skin: Negative for color change and rash.  Neurological: Positive for dizziness and light-headedness. Negative for seizures and syncope.  All other systems reviewed and  are negative.   Physical Exam Updated Vital Signs BP 95/62 (BP Location: Right Arm)   Pulse 74   Temp 98.5 F (36.9 C) (Oral)   Resp 17   Ht 5\' 2"  (1.575 m)   Wt 91.9 kg   LMP 01/02/2021   SpO2 97%   BMI 37.07 kg/m   Physical Exam Vitals and nursing note reviewed.  Constitutional:      General: She is not in acute distress.    Appearance: She is well-developed.  HENT:     Head: Normocephalic and atraumatic.  Eyes:     Conjunctiva/sclera: Conjunctivae normal.  Cardiovascular:     Rate and Rhythm: Normal rate and regular rhythm.     Heart sounds: No murmur heard.   Pulmonary:     Effort: Pulmonary effort is normal. No respiratory distress.     Breath sounds: Normal breath sounds.  Abdominal:     Palpations: Abdomen is soft.      Tenderness: There is no abdominal tenderness.  Musculoskeletal:        General: No deformity or signs of injury.     Cervical back: Neck supple.  Skin:    General: Skin is warm and dry.     Coloration: Skin is not jaundiced.  Neurological:     General: No focal deficit present.     Mental Status: She is alert.  Psychiatric:        Mood and Affect: Mood normal.     ED Results / Procedures / Treatments   Labs (all labs ordered are listed, but only abnormal results are displayed) Labs Reviewed  CBC  BASIC METABOLIC PANEL  URINALYSIS, ROUTINE W REFLEX MICROSCOPIC  HCG, QUANTITATIVE, PREGNANCY    EKG None  Radiology No results found.  Procedures Procedures   Medications Ordered in ED Medications - No data to display  ED Course  I have reviewed the triage vital signs and the nursing notes.  Pertinent labs & imaging results that were available during my care of the patient were reviewed by me and considered in my medical decision making (see chart for details).    MDM Rules/Calculators/A&P                         30 year old lady presents to ER with concern for dizziness.  On exam patient is well-appearing in no distress.  She is ambulatory without difficulty.  She has no associated neurologic complaints.  Basic labs are stable.  EKG unremarkable.  Given her well appearance, reassuring work-up, low suspicion for acute medical process today.  Initially consulted case management to assist with concern for possible homelessness.  Patient later states that her father is able to pick her up.  I attempted to contact patient's legal guardian x2 but no answer.  Social worker was able to get a hold of mother who provides verbal permission for discharge.   After the discussed management above, the patient was determined to be safe for discharge.  The patient was in agreement with this plan and all questions regarding their care were answered.  ED return precautions were discussed and  the patient will return to the ED with any significant worsening of condition.   Final Clinical Impression(s) / ED Diagnoses Final diagnoses:  Dizziness    Rx / DC Orders ED Discharge Orders    None       38, MD 01/16/21 1723

## 2021-01-16 NOTE — Social Work (Addendum)
4:34 Update: CSW received call from Pt mother, legal guardian.  Guardian states that Pt has been living and managing on her own for several months.  Mother is not payee, that is outsourced to [employer] in [place].  Guardian gives permission for Pt to be discharged when medically cleared.   CSW is available for any further social work needs.  Shella Spearing MSW LCSWA Transitions of Care  Clinical Social Worker  [facility]  [phone]      Pt has legal guardian, mother. Pt will attempt to reach mother.

## 2021-01-16 NOTE — ED Triage Notes (Signed)
Dizziness for a month, blurry vision-pt stated that she lost her glasses.

## 2021-01-20 ENCOUNTER — Emergency Department
Admission: EM | Admit: 2021-01-20 | Discharge: 2021-01-22 | Disposition: A | Discharge status: Home / Self Care | Payer: Medicaid Other | Attending: Emergency Medicine | Admitting: Emergency Medicine

## 2021-01-20 ENCOUNTER — Other Ambulatory Visit: Payer: Self-pay

## 2021-01-20 DIAGNOSIS — Z87891 Personal history of nicotine dependence: Secondary | ICD-10-CM | POA: Insufficient documentation

## 2021-01-20 DIAGNOSIS — F319 Bipolar disorder, unspecified: Secondary | ICD-10-CM | POA: Diagnosis not present

## 2021-01-20 DIAGNOSIS — F1210 Cannabis abuse, uncomplicated: Secondary | ICD-10-CM | POA: Diagnosis not present

## 2021-01-20 DIAGNOSIS — J45909 Unspecified asthma, uncomplicated: Secondary | ICD-10-CM | POA: Insufficient documentation

## 2021-01-20 DIAGNOSIS — F1410 Cocaine abuse, uncomplicated: Secondary | ICD-10-CM | POA: Diagnosis not present

## 2021-01-20 DIAGNOSIS — F209 Schizophrenia, unspecified: Secondary | ICD-10-CM | POA: Insufficient documentation

## 2021-01-20 DIAGNOSIS — Z87898 Personal history of other specified conditions: Secondary | ICD-10-CM

## 2021-01-20 DIAGNOSIS — F203 Undifferentiated schizophrenia: Secondary | ICD-10-CM | POA: Diagnosis not present

## 2021-01-20 DIAGNOSIS — F1491 Cocaine use, unspecified, in remission: Secondary | ICD-10-CM

## 2021-01-20 DIAGNOSIS — F1490 Cocaine use, unspecified, uncomplicated: Secondary | ICD-10-CM | POA: Diagnosis not present

## 2021-01-20 DIAGNOSIS — F3113 Bipolar disorder, current episode manic without psychotic features, severe: Secondary | ICD-10-CM | POA: Insufficient documentation

## 2021-01-20 DIAGNOSIS — Z5900 Homelessness unspecified: Secondary | ICD-10-CM | POA: Diagnosis not present

## 2021-01-20 DIAGNOSIS — Z20822 Contact with and (suspected) exposure to covid-19: Secondary | ICD-10-CM | POA: Insufficient documentation

## 2021-01-20 LAB — CBC
HCT: 47.2 % — ABNORMAL HIGH (ref 36.0–46.0)
Hemoglobin: 15.7 g/dL — ABNORMAL HIGH (ref 12.0–15.0)
MCH: 30.4 pg (ref 26.0–34.0)
MCHC: 33.3 g/dL (ref 30.0–36.0)
MCV: 91.3 fL (ref 80.0–100.0)
Platelets: 264 10*3/uL (ref 150–400)
RBC: 5.17 MIL/uL — ABNORMAL HIGH (ref 3.87–5.11)
RDW: 12.4 % (ref 11.5–15.5)
WBC: 13.5 10*3/uL — ABNORMAL HIGH (ref 4.0–10.5)
nRBC: 0 % (ref 0.0–0.2)

## 2021-01-20 LAB — URINE DRUG SCREEN, QUALITATIVE (ARMC ONLY)
Amphetamines, Ur Screen: NOT DETECTED
Barbiturates, Ur Screen: NOT DETECTED
Benzodiazepine, Ur Scrn: NOT DETECTED
Cannabinoid 50 Ng, Ur ~~LOC~~: NOT DETECTED
Cocaine Metabolite,Ur ~~LOC~~: POSITIVE — AB
MDMA (Ecstasy)Ur Screen: NOT DETECTED
Methadone Scn, Ur: NOT DETECTED
Opiate, Ur Screen: NOT DETECTED
Phencyclidine (PCP) Ur S: NOT DETECTED
Tricyclic, Ur Screen: NOT DETECTED

## 2021-01-20 LAB — COMPREHENSIVE METABOLIC PANEL
ALT: 18 U/L (ref 0–44)
AST: 14 U/L — ABNORMAL LOW (ref 15–41)
Albumin: 4.4 g/dL (ref 3.5–5.0)
Alkaline Phosphatase: 58 U/L (ref 38–126)
Anion gap: 9 (ref 5–15)
BUN: 14 mg/dL (ref 6–20)
CO2: 25 mmol/L (ref 22–32)
Calcium: 9.1 mg/dL (ref 8.9–10.3)
Chloride: 102 mmol/L (ref 98–111)
Creatinine, Ser: 0.72 mg/dL (ref 0.44–1.00)
GFR, Estimated: >60 mL/min (ref >60)
Glucose, Bld: 116 mg/dL — ABNORMAL HIGH (ref 70–99)
Potassium: 3.9 mmol/L (ref 3.5–5.1)
Sodium: 136 mmol/L (ref 135–145)
Total Bilirubin: 0.5 mg/dL (ref 0.3–1.2)
Total Protein: 7.1 g/dL (ref 6.5–8.1)

## 2021-01-20 LAB — POC URINE PREG, ED: Preg Test, Ur: NEGATIVE

## 2021-01-20 LAB — ACETAMINOPHEN LEVEL: Acetaminophen (Tylenol), Serum: <10 ug/mL — ABNORMAL LOW (ref 10–30)

## 2021-01-20 LAB — SALICYLATE LEVEL: Salicylate Lvl: <7 mg/dL — ABNORMAL LOW (ref 7.0–30.0)

## 2021-01-20 LAB — ETHANOL: Alcohol, Ethyl (B): <10 mg/dL (ref <10)

## 2021-01-20 NOTE — ED Provider Notes (Signed)
[facility] Emergency Department Provider Note   ____________________________________________   Event Date/Time   First MD Initiated Contact with Patient 01/20/21 2307     (approximate)  I have reviewed the triage vital signs and the nursing notes.   HISTORY  Chief Complaint Mental Health Problem    HPI Lindsay Perry is a 30 y.o. female who presents to the ED seeking behavioral medicine evaluation.  Patient with a history of schizophrenia who has been off her meds for the past year.  Feels that she needs to be placed at a psychiatric facility.  Denies active SI/HI/AH/VH.  Voices no medical complaints.     Past Medical History:  Diagnosis Date  . Asthma   . Bipolar 1 disorder (HCC)   . Schizophrenia [facility])     Patient Active Problem List   Diagnosis Date Noted  . Labor and delivery, indication for care 03/31/2020  . History of cocaine use 03/31/2020  . Encounter for initial insertion of intrauterine contraceptive device   . Supervision of high risk pregnancy, antepartum 03/18/2020  . Schizophrenia (HCC)   . Bipolar 1 disorder (HCC)     History reviewed. No pertinent surgical history.  Prior to Admission medications   Medication Sig Start Date End Date Taking? Authorizing Provider  ARIPiprazole (ABILIFY) 5 MG tablet Take 5 mg by mouth daily. Patient not taking: Reported on 01/20/2021    [provider]  benztropine (COGENTIN) 0.5 MG tablet Take 0.5 mg by mouth 2 (two) times daily. Patient not taking: Reported on 01/20/2021    [provider]  ibuprofen (ADVIL) 600 MG tablet Take 1 tablet (600 mg total) by mouth every 6 (six) hours. Patient not taking: Reported on 01/20/2021 04/01/20   Adam Phenix, MD  Prenat-Fe Poly-Methfol-FA-DHA (VITAFOL FE+) 90-0.6-0.4-200 MG CAPS TAKE ONE CAPSULE BY MOUTH DAILY Patient not taking: Reported on 01/20/2021 05/17/20   Warden Fillers, MD  Prenatal Vit-Fe Fumarate-FA (VITAFOL-OB) TABS Take 1 tablet  by mouth daily. Patient not taking: Reported on 01/20/2021 03/21/20   Malachy Chamber, MD  risperiDONE (RISPERDAL) 2 MG tablet Take 2 mg by mouth daily.  Patient not taking: Reported on 01/20/2021    [provider]    Allergies Cantaloupe (diagnostic)  History reviewed. No pertinent family history.  Social History Social History   Tobacco Use  . Smoking status: Former Smoker    Types: Cigarettes  . Smokeless tobacco: Never Used  Vaping Use  . Vaping Use: Some days  Substance Use Topics  . Alcohol use: No  . Drug use: Not Currently    Types: Cocaine, Marijuana    Review of Systems  Constitutional: No fever/chills Eyes: No visual changes. ENT: No sore throat. Cardiovascular: Denies chest pain. Respiratory: Denies shortness of breath. Gastrointestinal: No abdominal pain.  No nausea, no vomiting.  No diarrhea.  No constipation. Genitourinary: Negative for dysuria. Musculoskeletal: Negative for back pain. Skin: Negative for rash. Neurological: Negative for headaches, focal weakness or numbness. Psychiatric:  Positive for mental health issues.  ____________________________________________   PHYSICAL EXAM:  VITAL SIGNS: ED Triage Vitals  Enc Vitals Group     BP 01/20/21 2207 106/78     Pulse Rate 01/20/21 2207 78     Resp 01/20/21 2207 18     Temp 01/20/21 2207 (!) 97.5 F (36.4 C)     Temp Source 01/20/21 2207 Oral     SpO2 01/20/21 2207 100 %     Weight 01/20/21 2214 202 lb  13.2 oz (92 kg)     Height 01/20/21 2214 5\' 2"  (1.575 m)     Head Circumference --      Peak Flow --      Pain Score 01/20/21 2214 0     Pain Loc --      Pain Edu? --      Excl. in GC? --     Constitutional: Asleep, awakened for exam.  Alert and oriented. Well appearing and in no acute distress. Eyes: Conjunctivae are normal. PERRL. EOMI. Head: Atraumatic. Nose: No congestion/rhinnorhea. Mouth/Throat: Mucous membranes are moist.   Neck: No stridor.   Cardiovascular: Normal  rate, regular rhythm. Grossly normal heart sounds.  Good peripheral circulation. Respiratory: Normal respiratory effort.  No retractions. Lungs CTAB. Gastrointestinal: Soft and nontender. No distention. No abdominal bruits. No CVA tenderness. Musculoskeletal: No lower extremity tenderness nor edema.  No joint effusions. Neurologic:  Normal speech and language. No gross focal neurologic deficits are appreciated. No gait instability. Skin:  Skin is warm, dry and intact. No rash noted. Psychiatric: Mood and affect are normal. Speech and behavior are normal.  ____________________________________________   LABS (all labs ordered are listed, but only abnormal results are displayed)  Labs Reviewed  COMPREHENSIVE METABOLIC PANEL - Abnormal; Notable for the following components:      Result Value   Glucose, Bld 116 (*)    AST 14 (*)    All other components within normal limits  SALICYLATE LEVEL - Abnormal; Notable for the following components:   Salicylate Lvl <7.0 (*)    All other components within normal limits  ACETAMINOPHEN LEVEL - Abnormal; Notable for the following components:   Acetaminophen (Tylenol), Serum <10 (*)    All other components within normal limits  CBC - Abnormal; Notable for the following components:   WBC 13.5 (*)    RBC 5.17 (*)    Hemoglobin 15.7 (*)    HCT 47.2 (*)    All other components within normal limits  URINE DRUG SCREEN, QUALITATIVE ([facility] ONLY) - Abnormal; Notable for the following components:   Cocaine Metabolite,Ur [place] POSITIVE (*)    All other components within normal limits  RESP PANEL BY RT-PCR (FLU A&B, COVID) ARPGX2  ETHANOL  POC URINE PREG, ED   ____________________________________________  EKG  None ____________________________________________  RADIOLOGY I, Costas Sena J, personally viewed and evaluated these images (plain radiographs) as part of my medical decision making, as well as reviewing the written report by the radiologist.  ED MD  interpretation: None  Official radiology report(s): No results found.  ____________________________________________   PROCEDURES  Procedure(s) performed (including Critical Care):  Procedures   ____________________________________________   INITIAL IMPRESSION / ASSESSMENT AND PLAN / ED COURSE  As part of my medical decision making, I reviewed the following data within the electronic MEDICAL RECORD NUMBER Nursing notes reviewed and incorporated, Labs reviewed, Old chart reviewed, A consult was requested and obtained from this/these consultant(s) Psychiatry and Notes from prior ED visits     30 year old female with a history of bipolar disorder and schizophrenia off medications x1 year seeking behavioral medicine evaluation.  Contracts for safety while in the ED.  Will consult psychiatry for evaluation and disposition  Clinical Course as of 01/21/21 0723  Fri Jan 20, 2021  2302 Hemoglobin(!): 15.7 [PS]  2326 The patient has been placed in psychiatric observation due to the need to provide a safe environment for the patient while obtaining psychiatric consultation and evaluation, as well as ongoing medical and medication  management to treat the patient's condition. The patient has not been placed under full IVC at this time.   [JS]  Sat Jan 21, 2021  5093 Patient evaluated by overnight psychiatric NP who recommends reassessment in the morning. [JS]    Clinical Course User Index [JS] Irean Hong, MD [PS] Sharman Cheek, MD     ____________________________________________   FINAL CLINICAL IMPRESSION(S) / ED DIAGNOSES  Final diagnoses:  Schizophrenia, unspecified type (HCC)  Cocaine use     ED Discharge Orders    None      *Please note:  Glora Hulgan was evaluated in Emergency Department on 01/21/2021 for the symptoms described in the history of present illness. She was evaluated in the context of the global COVID-19 pandemic, which necessitated consideration that the  patient might be at risk for infection with the SARS-CoV-2 virus that causes COVID-19. Institutional protocols and algorithms that pertain to the evaluation of patients at risk for COVID-19 are in a state of rapid change based on information released by regulatory bodies including the CDC and federal and state organizations. These policies and algorithms were followed during the patient's care in the ED.  Some ED evaluations and interventions may be delayed as a result of limited staffing during and the pandemic.*   Note:  This document was prepared using Dragon voice recognition software and may include unintentional dictation errors.   Irean Hong, MD 01/21/21 [phone]

## 2021-01-20 NOTE — ED Provider Notes (Incomplete)
[facility] Emergency Department Provider Note   ____________________________________________   Event Date/Time   First MD Initiated Contact with Patient 01/20/21 2307     (approximate)  I have reviewed the triage vital signs and the nursing notes.   HISTORY  Chief Complaint Mental Health Problem    HPI Lindsay Perry is a 30 y.o. female who presents to the ED seeking behavioral medicine evaluation.  Patient with a history of schizophrenia who has been off her meds for the past year.  Feels that she needs to be placed at a psychiatric facility.  Denies active SI/HI/AH/VH.  Voices no medical complaints.     Past Medical History:  Diagnosis Date  . Asthma   . Bipolar 1 disorder (HCC)   . Schizophrenia [facility])     Patient Active Problem List   Diagnosis Date Noted  . Labor and delivery, indication for care 03/31/2020  . History of cocaine use 03/31/2020  . Encounter for initial insertion of intrauterine contraceptive device   . Supervision of high risk pregnancy, antepartum 03/18/2020  . Schizophrenia (HCC)   . Bipolar 1 disorder (HCC)     History reviewed. No pertinent surgical history.  Prior to Admission medications   Medication Sig Start Date End Date Taking? Authorizing Provider  ARIPiprazole (ABILIFY) 5 MG tablet Take 5 mg by mouth daily. Patient not taking: Reported on 01/20/2021    [provider]  benztropine (COGENTIN) 0.5 MG tablet Take 0.5 mg by mouth 2 (two) times daily. Patient not taking: Reported on 01/20/2021    [provider]  ibuprofen (ADVIL) 600 MG tablet Take 1 tablet (600 mg total) by mouth every 6 (six) hours. Patient not taking: Reported on 01/20/2021 04/01/20   Adam Phenix, MD  Prenat-Fe Poly-Methfol-FA-DHA (VITAFOL FE+) 90-0.6-0.4-200 MG CAPS TAKE ONE CAPSULE BY MOUTH DAILY Patient not taking: Reported on 01/20/2021 05/17/20   Warden Fillers, MD  Prenatal Vit-Fe Fumarate-FA (VITAFOL-OB) TABS Take 1 tablet  by mouth daily. Patient not taking: Reported on 01/20/2021 03/21/20   Malachy Chamber, MD  risperiDONE (RISPERDAL) 2 MG tablet Take 2 mg by mouth daily.  Patient not taking: Reported on 01/20/2021    [provider]    Allergies Cantaloupe (diagnostic)  History reviewed. No pertinent family history.  Social History Social History   Tobacco Use  . Smoking status: Former Smoker    Types: Cigarettes  . Smokeless tobacco: Never Used  Vaping Use  . Vaping Use: Some days  Substance Use Topics  . Alcohol use: No  . Drug use: Not Currently    Types: Cocaine, Marijuana    Review of Systems  Constitutional: No fever/chills Eyes: No visual changes. ENT: No sore throat. Cardiovascular: Denies chest pain. Respiratory: Denies shortness of breath. Gastrointestinal: No abdominal pain.  No nausea, no vomiting.  No diarrhea.  No constipation. Genitourinary: Negative for dysuria. Musculoskeletal: Negative for back pain. Skin: Negative for rash. Neurological: Negative for headaches, focal weakness or numbness. Psychiatric:  Positive for mental health issues.  ____________________________________________   PHYSICAL EXAM:  VITAL SIGNS: ED Triage Vitals  Enc Vitals Group     BP 01/20/21 2207 106/78     Pulse Rate 01/20/21 2207 78     Resp 01/20/21 2207 18     Temp 01/20/21 2207 (!) 97.5 F (36.4 C)     Temp Source 01/20/21 2207 Oral     SpO2 01/20/21 2207 100 %     Weight 01/20/21 2214 202 lb  13.2 oz (92 kg)     Height 01/20/21 2214 5\' 2"  (1.575 m)     Head Circumference --      Peak Flow --      Pain Score 01/20/21 2214 0     Pain Loc --      Pain Edu? --      Excl. in GC? --     Constitutional: Asleep, awakened for exam.  Alert and oriented. Well appearing and in no acute distress. Eyes: Conjunctivae are normal. PERRL. EOMI. Head: Atraumatic. Nose: No congestion/rhinnorhea. Mouth/Throat: Mucous membranes are moist.   Neck: No stridor.   Cardiovascular: Normal  rate, regular rhythm. Grossly normal heart sounds.  Good peripheral circulation. Respiratory: Normal respiratory effort.  No retractions. Lungs CTAB. Gastrointestinal: Soft and nontender. No distention. No abdominal bruits. No CVA tenderness. Musculoskeletal: No lower extremity tenderness nor edema.  No joint effusions. Neurologic:  Normal speech and language. No gross focal neurologic deficits are appreciated. No gait instability. Skin:  Skin is warm, dry and intact. No rash noted. Psychiatric: Mood and affect are normal. Speech and behavior are normal.  ____________________________________________   LABS (all labs ordered are listed, but only abnormal results are displayed)  Labs Reviewed  COMPREHENSIVE METABOLIC PANEL - Abnormal; Notable for the following components:      Result Value   Glucose, Bld 116 (*)    AST 14 (*)    All other components within normal limits  SALICYLATE LEVEL - Abnormal; Notable for the following components:   Salicylate Lvl <7.0 (*)    All other components within normal limits  ACETAMINOPHEN LEVEL - Abnormal; Notable for the following components:   Acetaminophen (Tylenol), Serum <10 (*)    All other components within normal limits  CBC - Abnormal; Notable for the following components:   WBC 13.5 (*)    RBC 5.17 (*)    Hemoglobin 15.7 (*)    HCT 47.2 (*)    All other components within normal limits  URINE DRUG SCREEN, QUALITATIVE ([facility] ONLY) - Abnormal; Notable for the following components:   Cocaine Metabolite,Ur [place] POSITIVE (*)    All other components within normal limits  ETHANOL  POC URINE PREG, ED   ____________________________________________  EKG  None ____________________________________________  RADIOLOGY I, SUNG,JADE J, personally viewed and evaluated these images (plain radiographs) as part of my medical decision making, as well as reviewing the written report by the radiologist.  ED MD interpretation: None  Official radiology  report(s): No results found.  ____________________________________________   PROCEDURES  Procedure(s) performed (including Critical Care):  Procedures   ____________________________________________   INITIAL IMPRESSION / ASSESSMENT AND PLAN / ED COURSE  As part of my medical decision making, I reviewed the following data within the electronic MEDICAL RECORD NUMBER Nursing notes reviewed and incorporated, Labs reviewed, Old chart reviewed, A consult was requested and obtained from this/these consultant(s) Psychiatry and Notes from prior ED visits     30 year old female with a history of bipolar disorder and schizophrenia off medications x1 year seeking behavioral medicine evaluation.  Contracts for safety while in the ED.  Will consult psychiatry for evaluation and disposition  Clinical Course as of 01/20/21 2326  Fri Jan 20, 2021  2302 Hemoglobin(!): 15.7 [PS]  2326 The patient has been placed in psychiatric observation due to the need to provide a safe environment for the patient while obtaining psychiatric consultation and evaluation, as well as ongoing medical and medication management to treat the patient's condition. The patient has  not been placed under full IVC at this time.   [JS]    Clinical Course User Index [JS] Irean Hong, MD [PS] Sharman Cheek, MD     ____________________________________________   FINAL CLINICAL IMPRESSION(S) / ED DIAGNOSES  Final diagnoses:  Schizophrenia, unspecified type (HCC)  Cocaine use     ED Discharge Orders    None      *Please note:  Lindsay Perry was evaluated in Emergency Department on 01/20/2021 for the symptoms described in the history of present illness. She was evaluated in the context of the global COVID-19 pandemic, which necessitated consideration that the patient might be at risk for infection with the SARS-CoV-2 virus that causes COVID-19. Institutional protocols and algorithms that pertain to the evaluation of  patients at risk for COVID-19 are in a state of rapid change based on information released by regulatory bodies including the CDC and federal and state organizations. These policies and algorithms were followed during the patient's care in the ED.  Some ED evaluations and interventions may be delayed as a result of limited staffing during and the pandemic.*   Note:  This document was prepared using Dragon voice recognition software and may include unintentional dictation errors.

## 2021-01-20 NOTE — ED Notes (Signed)
Pt Belongings:   Camo leggings Red underwear Grey T-Shirt Black Jacket Black and white sandals  $40 and some loose change Cigarettes

## 2021-01-20 NOTE — ED Notes (Signed)
Patient given blankets, sandwich tray and apple juice per request. Patient A&Ox4, VSS.

## 2021-01-20 NOTE — ED Triage Notes (Signed)
Pt presents to ER stating she has been off her mental health meds (unknown what medicine) for around a year.  Pt states she needs to be placed at a psychiatric facility to get back on her meds and on her feet.  Pt reports hx of BPD and schizophrenia.  Pt denies SI/HI at this time.  Pt A&O and cooperative with staff.

## 2021-01-21 DIAGNOSIS — Z5900 Homelessness unspecified: Secondary | ICD-10-CM

## 2021-01-21 DIAGNOSIS — F319 Bipolar disorder, unspecified: Secondary | ICD-10-CM

## 2021-01-21 DIAGNOSIS — F1210 Cannabis abuse, uncomplicated: Secondary | ICD-10-CM

## 2021-01-21 DIAGNOSIS — F203 Undifferentiated schizophrenia: Secondary | ICD-10-CM | POA: Diagnosis not present

## 2021-01-21 DIAGNOSIS — F1410 Cocaine abuse, uncomplicated: Secondary | ICD-10-CM

## 2021-01-21 LAB — RESP PANEL BY RT-PCR (FLU A&B, COVID) ARPGX2
Influenza A by PCR: NEGATIVE
Influenza B by PCR: NEGATIVE
SARS Coronavirus 2 by RT PCR: NEGATIVE

## 2021-01-21 NOTE — ED Notes (Signed)
Hourly rounding completed at this time, patient currently asleep in room. No complaints, stable, and in no acute distress. Q15 minute rounds and monitoring via Security Cam[employer] to continue. 

## 2021-01-21 NOTE — ED Notes (Signed)
In attempt to continue to find safe DC for pt, this nurse obtains address for her mother (legal guardian) - [address] [place], [place]. Pt does not know either mom or friends phone numbers to reach out to. Lindsay Perry, Charge nurse notified, states he will attempt to see if [facility] [employer] can do safety check to address to attempt to reach mother. Will continue to monitor.

## 2021-01-21 NOTE — ED Notes (Signed)
Pt requests snack and drink, provided with crackers, apple sauce and grape juice per request. Pt has no complaints. States she is here due to staff finding her a place to go and is homeless.

## 2021-01-21 NOTE — BH Assessment (Signed)
Comprehensive Clinical Assessment (CCA) Note  01/21/2021 Lindsay Perry [id number]  Chief Complaint:  Chief Complaint  Patient presents with  . Mental Health Problem   Visit Diagnosis: Bipolar Disorder, Schizophrenia  [name] arrived to the ED by way of law enforcement.  She reports, "I need medicine and I need treatment". "I am trying to get transferred to [facility].  I don't have anywhere to live and I am off my medications. I haven't talked to my mom and I am kind of scared to right now." She reports that she is supposed to be taking Cogentin, Risperdal, and Haldol. She states that because she was struggling she did not get her medications.  When asked if she is depressed she responded "yes", states "I get sad".  She reports "a little bit" of anxiety. She denied suicidal ideation or intent. She denied homicidal ideation or intent. She denied having auditory or visual hallucinations.  She denied the use of alcohol or drugs.  "I don't want to go back out those doors, I'm scared, because I don't have anywhere to live'.    CCA Screening, Triage and Referral (STR)  Patient Reported Information How did you hear about [place]? Legal System  Referral name: No data recorded Referral phone number: No data recorded  Whom do you see for routine medical problems? -- (Denied routine medical care)  Practice/Facility Name: No data recorded Practice/Facility Phone Number: No data recorded Name of Contact: No data recorded Contact Number: No data recorded Contact Fax Number: No data recorded Prescriber Name: No data recorded Prescriber Address (if known): No data recorded  What Is the Reason for Your Visit/Call Today? No data recorded How Long Has This Been Causing You Problems? > than 6 months  What Do You Feel Would Help You the Most Today? Financial Resources; Housing Assistance   Have You Recently Been in Any Inpatient Treatment (Hospital/Detox/Crisis Center/28-Day Program)? No (Reports  last placement about 8 years ago)  Name/Location of Program/Hospital:No data recorded How Long Were You There? No data recorded When Were You Discharged? No data recorded  Have You Ever Received Services From [facility] Before? No  Who Do You See at [facility]? No data recorded  Have You Recently Had Any Thoughts About Hurting Yourself? No  Are You Planning to Commit Suicide/Harm Yourself At This time? No   Have you Recently Had Thoughts About Hurting Someone [name]? No  Explanation: No data recorded  Have You Used Any Alcohol or Drugs in the Past 24 Hours? No  How Long Ago Did You Use Drugs or Alcohol? No data recorded What Did You Use and How Much? No data recorded  Do You Currently Have a Therapist/Psychiatrist? No  Name of Therapist/Psychiatrist: No data recorded  Have You Been Recently Discharged From Any Office Practice or Programs? No  Explanation of Discharge From Practice/Program: No data recorded    CCA Screening Triage Referral Assessment Type of Contact: Face-to-Face  Is this Initial or Reassessment? No data recorded Date Telepsych consult ordered in [facility]:  No data recorded Time Telepsych consult ordered in [facility]:  No data recorded  Patient Reported Information Reviewed? Yes  Patient Left Without Being Seen? No data recorded Reason for Not Completing Assessment: No data recorded  Collateral Involvement: None   Does Patient Have a Court Appointed Legal Guardian? No data recorded Name and Contact of Legal Guardian: No data recorded If Minor and Not Living with Parent(s), Who has Custody? No data recorded Is CPS involved or ever  been involved? Never  Is APS involved or ever been involved? Never   Patient Determined To Be At Risk for Harm To Self or Others Based on Review of Patient Reported Information or Presenting Complaint? No  Method: No data recorded Availability of Means: No data recorded Intent: No data recorded Notification Required: No data  recorded Additional Information for Danger to Others Potential: No data recorded Additional Comments for Danger to Others Potential: No data recorded Are There Guns or Other Weapons in Your Home? No data recorded Types of Guns/Weapons: No data recorded Are These Weapons Safely Secured?                            No data recorded Who Could Verify You Are Able To Have These Secured: No data recorded Do You Have any Outstanding Charges, Pending Court Dates, Parole/Probation? No data recorded Contacted To Inform of Risk of Harm To Self or Others: No data recorded  Location of Assessment: [facility] ED   Does Patient Present under Involuntary Commitment? No  IVC Papers Initial File Date: No data recorded  [place] of Residence: [place]   Patient Currently Receiving the Following Services: No data recorded  Determination of Need: No data recorded  Options For Referral: No data recorded    CCA Biopsychosocial Intake/Chief Complaint:  "I Need a place to live'  Current Symptoms/Problems: No data recorded  Patient Reported Schizophrenia/Schizoaffective Diagnosis in Past: Yes   Strengths: No data recorded Preferences: No data recorded Abilities: No data recorded  Type of Services Patient Feels are Needed: Long term inpatient   Initial Clinical Notes/Concerns: No data recorded  Mental Health Symptoms Depression:  -- ("I feel sad")   Duration of Depressive symptoms: No data recorded  Mania:  None   Anxiety:   None   Psychosis:  None   Duration of Psychotic symptoms: No data recorded  Trauma:  None   Obsessions:  None   Compulsions:  None   Inattention:  None   Hyperactivity/Impulsivity:  N/A   Oppositional/Defiant Behaviors:  None   Emotional Irregularity:  None   Other Mood/Personality Symptoms:  No data recorded   Mental Status Exam Appearance and self-care  Stature:  Average   Weight:  No data recorded  Clothing:  No data recorded  Grooming:  Normal    Cosmetic use:  No data recorded  Posture/gait:  Normal   Motor activity:  Not Remarkable   Sensorium  Attention:  Normal   Concentration:  Normal   Orientation:  X5   Recall/memory:  Normal   Affect and Mood  Affect:  Full Range   Mood:  No data recorded  Relating  Eye contact:  Normal   Facial expression:  No data recorded  Attitude toward examiner:  Cooperative   Thought and Language  Speech flow: Clear and Coherent   Thought content:  Appropriate to Mood and Circumstances   Preoccupation:  No data recorded  Hallucinations:  None   Organization:  No data recorded  [employer] of Knowledge:  Fair   Intelligence:  No data recorded  Abstraction:  No data recorded  Judgement:  No data recorded  Reality Testing:  No data recorded  Insight:  Good   Decision Making:  Normal   Social Functioning  Social Maturity:  Self-centered   Social Judgement:  No data recorded  Stress  Stressors:  Housing; Office manager Ability:  Deficient supports  Skill Deficits:  Responsibility   Supports:  Friends/Service system     Religion:    Leisure/Recreation: Leisure / Recreation Do You Have Hobbies?: No  Exercise/Diet: Exercise/Diet Do You Exercise?: No Have You Gained or Lost A Significant Amount of Weight in the Past Six Months?: No Do You Follow a Special Diet?: No Do You Have Any Trouble Sleeping?: No   CCA Employment/Education Employment/Work Situation: Employment / Work Situation Employment situation: On disability Why is patient on disability: Bipolar, schizophrenia How long has patient been on disability: 13 years Has patient ever been in the [employer]?: No  Education: Education Is Patient Currently Attending School?: No Last Grade Completed: 8 Name of High School: [employer] Did Garment/textile technologist From [employer]?: No Did You Product manager?: No Did Designer, television/film set?: No Did You Have An Individualized Education  Program (IIEP): No Did You Have Any Difficulty At School?: No Patient's Education Has Been Impacted by Current Illness: No   CCA Family/Childhood History Family and Relationship History: Family history Are you sexually active?: No Does patient have children?: No  Childhood History:  Childhood History By whom was/is the patient raised?: Both parents Description of patient's relationship with caregiver when they were a child: It was goo Patient's description of current relationship with people who raised him/her: Still good How were you disciplined when you got in trouble as a child/adolescent?: grounded Does patient have siblings?: Yes Number of Siblings: 3 Description of patient's current relationship with siblings: good Did patient suffer any verbal/emotional/physical/sexual abuse as a child?: No Did patient suffer from severe childhood neglect?: No Has patient ever been sexually abused/assaulted/raped as an adolescent or adult?: No Was the patient ever a victim of a crime or a disaster?: No Witnessed domestic violence?: No Has patient been affected by domestic violence as an adult?: No  Child/Adolescent Assessment:     CCA Substance Use Alcohol/Drug Use: Alcohol / Drug Use History of alcohol / drug use?: No history of alcohol / drug abuse                         ASAM's:  Six Dimensions of Multidimensional Assessment  Dimension 1:  Acute Intoxication and/or Withdrawal Potential:      Dimension 2:  Biomedical Conditions and Complications:      Dimension 3:  Emotional, Behavioral, or Cognitive Conditions and Complications:     Dimension 4:  Readiness to Change:     Dimension 5:  Relapse, Continued use, or Continued Problem Potential:     Dimension 6:  Recovery/Living Environment:     ASAM Severity Score:    ASAM Recommended Level of Treatment:     Substance use Disorder (SUD)    Recommendations for Services/Supports/Treatments:    DSM5  Diagnoses: Patient Active Problem List   Diagnosis Date Noted  . Labor and delivery, indication for care 03/31/2020  . History of cocaine use 03/31/2020  . Encounter for initial insertion of intrauterine contraceptive device   . Supervision of high risk pregnancy, antepartum 03/18/2020  . Schizophrenia (HCC)   . Bipolar 1 disorder (HCC)       Justice Deeds, Counselor

## 2021-01-21 NOTE — ED Notes (Signed)
Attempted to contact pts legal guardian for pts DC, no answer, HIPPA message left on voicemail and call back number provided. Pt states she is homeless and has been "living on the streets for a long time." After questions she reports long time is for 3 weeks. Will continue to work on situation.

## 2021-01-21 NOTE — ED Notes (Signed)
Hourly rounding completed at this time, patient currently awake in room. Pt requests eye drops, pain medication for her back, and an inhaler for asthma that she is unclear of the name, Dr. Derrill Kay sent secure chat, will follow up. Q15 minute rounds and monitoring via [employer] to continue.

## 2021-01-21 NOTE — ED Provider Notes (Signed)
Psych team evaluated the patient. At this time do not feel she requires inpatient admission. Will discharge to follow up with her outpatient provider.     Phineas Semen, MD 01/21/21 [id number]

## 2021-01-21 NOTE — ED Notes (Signed)
Patient came back to the door and stated that she knows she doesn't have to go and wants to go to a group home and agrees to stay here.

## 2021-01-21 NOTE — ED Provider Notes (Signed)
Emergency Medicine Observation Re-evaluation Note  Lindsay Perry is a 30 y.o. female, seen on rounds today.  Pt initially presented to the ED for complaints of Mental Health Problem Currently, the patient is sleeping.  Physical Exam  BP 94/65 (BP Location: Left Arm)   Pulse 72   Temp (!) 97.5 F (36.4 C) (Oral)   Resp 16   Ht 5\' 2"  (1.575 m)   Wt 92 kg   LMP 12/19/2020 (Approximate)   SpO2 98%   BMI 37.10 kg/m  Physical Exam Constitutional:      Appearance: She is not ill-appearing or toxic-appearing.  HENT:     Head: Atraumatic.  Cardiovascular:     Comments: Well perfused Pulmonary:     Effort: Pulmonary effort is normal.  Abdominal:     General: There is no distension.  Musculoskeletal:        General: No deformity.  Skin:    Findings: No rash.  Neurological:     General: No focal deficit present.     Cranial Nerves: No cranial nerve deficit.      ED Course / MDM  EKG:   I have reviewed the labs performed to date as well as medications administered while in observation.  Recent changes in the last 24 hours include attempted evaluation by TTS and psychiatry overnight, but patient would not participate.  Plan  Current plan is for psychiatric reevaluation this morning. Patient is not under full IVC at this time.   02/18/2021, MD 01/21/21 [phone]

## 2021-01-21 NOTE — BH Assessment (Signed)
This Clinical research associate attempted to assess pt along with psych NP Annice Pih T., however pt was unable to awaken and participate. TTS to follow up.

## 2021-01-21 NOTE — ED Notes (Signed)
Pt asleep at this time, unable to collect vitals. Will collect pt vitals once awake. 

## 2021-01-21 NOTE — ED Notes (Signed)
Patient had been sleeping. Patient states she feels better now and wants to go home. Patient states she thinks her brother will come and get her. MD aware.

## 2021-01-21 NOTE — ED Notes (Signed)
Report given to Chris RN

## 2021-01-21 NOTE — ED Notes (Signed)
Report received from [place], [place] including situation, background, assessment and recommendations. Patient sleeping, respirations regular and unlabored. Q15 minute rounds and security camera observation to continue. Will assess patient and collect vital signs once awake.

## 2021-01-21 NOTE — ED Notes (Signed)
Pt awake and to restroom at this time.

## 2021-01-21 NOTE — Consult Note (Signed)
[facility] Face-to-Face Psychiatry Consult   Reason for Consult: Mental Health Problem Referring Physician: Dr. Dolores Frame Patient Identification: Lindsay Perry MRN:  [id number] Principal Diagnosis: <principal problem not specified> Diagnosis:  Active Problems:   Schizophrenia (HCC)   Bipolar 1 disorder (HCC)   History of cocaine use   Total Time spent with patient: 20 minutes  Subjective: " Some lady said she will find me somewhat to live." Lindsay Perry is a 30 y.o. female patient presented to [facility] ED via law enforcement and voluntarily. There were attempts made on 08.08.22 to assess the patient, but she was difficult to be aroused. The patient expressed, "I am trying to get transferred to [facility]. I don't have anywhere to live, and I am off my medications. I haven't talked to my mom, and I am scared to." She reports that she is supposed to be taking Cogentin, Risperdal, and Haldol but does not know the dosage she takes. The patient was seen face-to-face by this provider; the chart was reviewed and consulted with Dr. Derrill Kay on 01/21/2021 due to the patient's care. It was discussed with the EDP that the patient does not meet the criteria to be admitted to the psychiatric inpatient unit.  On evaluation, the patient is alert and oriented x 4, calm, cooperative, and mood-congruent with affect. The patient does not appear to be responding to internal or external stimuli. Neither is the patient presenting with any delusional thinking. The patient denies auditory and visual hallucinations. The patient denies any suicidal, homicidal, or self-harm ideations. The patient is not presenting with any psychotic or paranoid behaviors. During an encounter with the patient, she was able to answer questions appropriately.  HPI: Per Dr. Dolores Frame; [name] is a 30 y.o. female who presents to the ED seeking behavioral medicine evaluation.  Patient with a history of schizophrenia who has been off her meds for the past  year.  Feels that she needs to be placed at a psychiatric facility.  Denies active SI/HI/AH/VH.  Voices no medical complaints.  Past Psychiatric History:  Bipolar 1 disorder (HCC) Schizophrenia (HCC)  Risk to Self:   Risk to Others:   Prior Inpatient Therapy:   Prior Outpatient Therapy:    Past Medical History:  Past Medical History:  Diagnosis Date  . Asthma   . Bipolar 1 disorder (HCC)   . Schizophrenia (HCC)    History reviewed. No pertinent surgical history. Family History: History reviewed. No pertinent family history. Family Psychiatric  History:  Social History:  Social History   Substance and Sexual Activity  Alcohol Use No     Social History   Substance and Sexual Activity  Drug Use Not Currently  . Types: Cocaine, Marijuana    Social History   Socioeconomic History  . Marital status: Single    Spouse name: Not on file  . Number of children: Not on file  . Years of education: Not on file  . Highest education level: Not on file  Occupational History  . Not on file  Tobacco Use  . Smoking status: Former Smoker    Types: Cigarettes  . Smokeless tobacco: Never Used  Vaping Use  . Vaping Use: Some days  Substance and Sexual Activity  . Alcohol use: No  . Drug use: Not Currently    Types: Cocaine, Marijuana  . Sexual activity: Yes  Other Topics Concern  . Not on file  Social History Narrative  . Not on file   Social Determinants of Health   Financial  Resource Strain: Not on file  Food Insecurity: Food Insecurity Present  . Worried About Programme researcher, broadcasting/film/video in the Last Year: Often true  . Ran Out of Food in the Last Year: Often true  Transportation Needs: No Transportation Needs  . Lack of Transportation (Medical): No  . Lack of Transportation (Non-Medical): No  Physical Activity: Not on file  Stress: Not on file  Social Connections: Not on file   Additional Social History:    Allergies:   Allergies  Allergen Reactions  . Cantaloupe  (Diagnostic) Hives    Labs:  Results for orders placed or performed during the hospital encounter of 01/20/21 (from the past 48 hour(s))  Comprehensive metabolic panel     Status: Abnormal   Collection Time: 01/20/21 10:17 PM  Result Value Ref Range   Sodium 136 135 - 145 mmol/L   Potassium 3.9 3.5 - 5.1 mmol/L   Chloride 102 98 - 111 mmol/L   CO2 25 22 - 32 mmol/L   Glucose, Bld 116 (H) 70 - 99 mg/dL    Comment: Glucose reference range applies only to samples taken after fasting for at least 8 hours.   BUN 14 6 - 20 mg/dL   Creatinine, Ser 0.25 0.44 - 1.00 mg/dL   Calcium 9.1 8.9 - 42.7 mg/dL   Total Protein 7.1 6.5 - 8.1 g/dL   Albumin 4.4 3.5 - 5.0 g/dL   AST 14 (L) 15 - 41 U/L   ALT 18 0 - 44 U/L   Alkaline Phosphatase 58 38 - 126 U/L   Total Bilirubin 0.5 0.3 - 1.2 mg/dL   GFR, Estimated >06 >23 mL/min    Comment: (NOTE) Calculated using the CKD-EPI Creatinine Equation (2021)    Anion gap 9 5 - 15    Comment: Performed at [facility], [address] [place], [place] [address]  Ethanol     Status: None   Collection Time: 01/20/21 10:17 PM  Result Value Ref Range   Alcohol, Ethyl (B) <10 <10 mg/dL    Comment: (NOTE) Lowest detectable limit for serum alcohol is 10 mg/dL.  For medical purposes only. Performed at [facility], [address] [place], [place] [address]   Salicylate level     Status: Abnormal   Collection Time: 01/20/21 10:17 PM  Result Value Ref Range   Salicylate Lvl <7.0 (L) 7.0 - 30.0 mg/dL    Comment: Performed at [facility], [address] [place], [place] [address]  Acetaminophen level     Status: Abnormal   Collection Time: 01/20/21 10:17 PM  Result Value Ref Range   Acetaminophen (Tylenol), Serum <10 (L) 10 - 30 ug/mL    Comment: (NOTE) Therapeutic concentrations vary significantly. A range of 10-30 ug/mL  may be an effective concentration for many patients. However, some  are best treated at  concentrations outside of this range. Acetaminophen concentrations >150 ug/mL at 4 hours after ingestion  and >50 ug/mL at 12 hours after ingestion are often associated with  toxic reactions.  Performed at [facility], [address] [place], [place] [address]   cbc     Status: Abnormal   Collection Time: 01/20/21 10:17 PM  Result Value Ref Range   WBC 13.5 (H) 4.0 - 10.5 K/uL   RBC 5.17 (H) 3.87 - 5.11 MIL/uL   Hemoglobin 15.7 (H) 12.0 - 15.0 g/dL   HCT 69.4 (H) 85.4 - 62.7 %   MCV 91.3 80.0 - 100.0 fL  MCH 30.4 26.0 - 34.0 pg   MCHC 33.3 30.0 - 36.0 g/dL   RDW 16.112.4 09.611.5 - 04.515.5 %   Platelets 264 150 - 400 K/uL   nRBC 0.0 0.0 - 0.2 %    Comment: Performed at [facility], [address] [place], [place] [address]  POC urine preg, ED     Status: None   Collection Time: 01/20/21 10:33 PM  Result Value Ref Range   Preg Test, Ur NEGATIVE NEGATIVE    Comment:        THE SENSITIVITY OF THIS METHODOLOGY IS >24 mIU/mL   Urine Drug Screen, Qualitative     Status: Abnormal   Collection Time: 01/20/21 10:34 PM  Result Value Ref Range   Tricyclic, Ur Screen NONE DETECTED NONE DETECTED   Amphetamines, Ur Screen NONE DETECTED NONE DETECTED   MDMA (Ecstasy)Ur Screen NONE DETECTED NONE DETECTED   Cocaine Metabolite,Ur [place] POSITIVE (A) NONE DETECTED   Opiate, Ur Screen NONE DETECTED NONE DETECTED   Phencyclidine (PCP) Ur S NONE DETECTED NONE DETECTED   Cannabinoid 50 Ng, Ur [place] NONE DETECTED NONE DETECTED   Barbiturates, Ur Screen NONE DETECTED NONE DETECTED   Benzodiazepine, Ur Scrn NONE DETECTED NONE DETECTED   Methadone Scn, Ur NONE DETECTED NONE DETECTED    Comment: (NOTE) Tricyclics + metabolites, urine    Cutoff 1000 ng/mL Amphetamines + metabolites, urine  Cutoff 1000 ng/mL MDMA (Ecstasy), urine              Cutoff 500 ng/mL Cocaine Metabolite, urine          Cutoff 300 ng/mL Opiate + metabolites, urine        Cutoff 300 ng/mL Phencyclidine (PCP), urine          Cutoff 25 ng/mL Cannabinoid, urine                 Cutoff 50 ng/mL Barbiturates + metabolites, urine  Cutoff 200 ng/mL Benzodiazepine, urine              Cutoff 200 ng/mL Methadone, urine                   Cutoff 300 ng/mL  The urine drug screen provides only a preliminary, unconfirmed analytical test result and should not be used for non-medical purposes. Clinical consideration and professional judgment should be applied to any positive drug screen result due to possible interfering substances. A more specific alternate chemical method must be used in order to obtain a confirmed analytical result. Gas chromatography / mass spectrometry (GC/MS) is the preferred confirm atory method. Performed at [facility], [address] [place], [place] [address]   Resp Panel by RT-PCR (Flu A&B, Covid) Nasopharyngeal Swab     Status: None   Collection Time: 01/20/21 11:40 PM   Specimen: Nasopharyngeal Swab; Nasopharyngeal(NP) swabs in vial transport medium  Result Value Ref Range   SARS Coronavirus 2 by RT PCR NEGATIVE NEGATIVE    Comment: (NOTE) SARS-CoV-2 target nucleic acids are NOT DETECTED.  The SARS-CoV-2 RNA is generally detectable in upper respiratory specimens during the acute phase of infection. The lowest concentration of SARS-CoV-2 viral copies this assay can detect is 138 copies/mL. A negative result does not preclude SARS-Cov-2 infection and should not be used as the sole basis for treatment or other patient management decisions. A negative result may occur with  improper specimen collection/handling, submission of specimen other than nasopharyngeal swab, presence of viral mutation(s) within the areas targeted by  this assay, and inadequate number of viral copies(<138 copies/mL). A negative result must be combined with clinical observations, patient history, and epidemiological information. The expected result is Negative.  Fact Sheet for Patients:   BloggerCourse.com  Fact Sheet for Healthcare Providers:  SeriousBroker.it  This test is no t yet approved or cleared by the [place] FDA and  has been authorized for detection and/or diagnosis of SARS-CoV-2 by FDA under an Emergency Use Authorization (EUA). This EUA will remain  in effect (meaning this test can be used) for the duration of the COVID-19 declaration under Section 564(b)(1) of the Act, 21 U.S.C.section 360bbb-3(b)(1), unless the authorization is terminated  or revoked sooner.       Influenza A by PCR NEGATIVE NEGATIVE   Influenza B by PCR NEGATIVE NEGATIVE    Comment: (NOTE) The Xpert Xpress SARS-CoV-2/FLU/RSV plus assay is intended as an aid in the diagnosis of influenza from Nasopharyngeal swab specimens and should not be used as a sole basis for treatment. Nasal washings and aspirates are unacceptable for Xpert Xpress SARS-CoV-2/FLU/RSV testing.  Fact Sheet for Patients: BloggerCourse.com  Fact Sheet for Healthcare Providers: SeriousBroker.it  This test is not yet approved or cleared by the [place] FDA and has been authorized for detection and/or diagnosis of SARS-CoV-2 by FDA under an Emergency Use Authorization (EUA). This EUA will remain in effect (meaning this test can be used) for the duration of the COVID-19 declaration under Section 564(b)(1) of the Act, 21 U.S.C. section 360bbb-3(b)(1), unless the authorization is terminated or revoked.  Performed at [facility], [address] [place], [place] [address]     No current facility-administered medications for this encounter.   Current Outpatient Medications  Medication Sig Dispense Refill  . ARIPiprazole (ABILIFY) 5 MG tablet Take 5 mg by mouth daily. (Patient not taking: Reported on 01/20/2021)    . benztropine (COGENTIN) 0.5 MG tablet Take 0.5 mg by mouth 2 (two) times daily.  (Patient not taking: Reported on 01/20/2021)    . ibuprofen (ADVIL) 600 MG tablet Take 1 tablet (600 mg total) by mouth every 6 (six) hours. (Patient not taking: Reported on 01/20/2021) 30 tablet 0  . Prenat-Fe Poly-Methfol-FA-DHA (VITAFOL FE+) 90-0.6-0.4-200 MG CAPS TAKE ONE CAPSULE BY MOUTH DAILY (Patient not taking: Reported on 01/20/2021) 30 capsule 6  . Prenatal Vit-Fe Fumarate-FA (VITAFOL-OB) TABS Take 1 tablet by mouth daily. (Patient not taking: Reported on 01/20/2021) 90 tablet 1  . risperiDONE (RISPERDAL) 2 MG tablet Take 2 mg by mouth daily.  (Patient not taking: Reported on 01/20/2021)      Musculoskeletal: Strength & Muscle Tone: within normal limits Gait & Station: normal Patient leans: N/A  Psychiatric Specialty Exam:  Presentation  General Appearance: Appropriate for Environment  Eye Contact:Good  Speech:Clear and Coherent  Speech Volume:Normal  Handedness:Right   Mood and Affect  Mood:Euthymic  Affect:Appropriate; Congruent   Thought Process  Thought Processes:Coherent  Descriptions of Associations:Intact  Orientation:Full (Time, Place and Person)  Thought Content:Logical  History of Schizophrenia/Schizoaffective disorder:Yes  Duration of Psychotic Symptoms:No data recorded Hallucinations:Hallucinations: None  Ideas of Reference:None  Suicidal Thoughts:Suicidal Thoughts: No  Homicidal Thoughts:Homicidal Thoughts: No   Sensorium  Memory:Immediate Good; Recent Good; Remote Good  Judgment:Intact  Insight:Good   Executive Functions  Concentration:Good  Attention Span:Good  Recall:Good  Fund of Knowledge:Good  Language:Good   Psychomotor Activity  Psychomotor Activity:Psychomotor Activity: Normal   Assets  Assets:Communication Skills; Housing; Resilience; Social Support   Sleep  Sleep:Sleep: Good   Physical Exam:  Physical Exam Vitals and nursing note reviewed.  Constitutional:      Appearance: Normal appearance. She is normal  weight.  HENT:     Right Ear: External ear normal.     Left Ear: External ear normal.     Nose: Nose normal.     Mouth/Throat:     Mouth: Mucous membranes are moist.  Cardiovascular:     Rate and Rhythm: Normal rate.  Pulmonary:     Effort: Pulmonary effort is normal.  Musculoskeletal:        General: Normal range of motion.     Cervical back: Normal range of motion and neck supple.  Neurological:     General: No focal deficit present.     Mental Status: She is alert and oriented to person, place, and time. Mental status is at baseline.  Psychiatric:        Attention and Perception: Attention and perception normal.        Mood and Affect: Mood and affect normal.        Speech: Speech normal.        Behavior: Behavior normal. Behavior is cooperative.        Thought Content: Thought content normal.        Cognition and Memory: Cognition and memory normal.        Judgment: Judgment normal.    Review of Systems  Psychiatric/Behavioral: Positive for substance abuse.  All other systems reviewed and are negative.  Blood pressure 104/75, pulse 80, temperature 97.7 F (36.5 C), temperature source Oral, resp. rate 18, height 5\' 2"  (1.575 m), weight 92 kg, last menstrual period 12/19/2020, SpO2 95 %, unknown if currently breastfeeding. Body mass index is 37.1 kg/m.  Treatment Plan Summary: Plan The patient is not a safety risk to herself or others and does not require psychiatric inpatient admission for stabilization and treatment.  Disposition: No evidence of imminent risk to self or others at present.   Patient does not meet criteria for psychiatric inpatient admission. Supportive therapy provided about ongoing stressors. Refer to IOP. Discussed crisis plan, support from social network, calling 911, coming to the Emergency Department, and calling Suicide Hotline.  02/18/2021, NP 01/21/2021 9:30 PM

## 2021-01-21 NOTE — ED Notes (Signed)
Pt showered and given new scrub set.  Pt now in room eating dinner tray.

## 2021-01-21 NOTE — Discharge Instructions (Addendum)
Please seek medical attention and help for any thoughts about wanting to harm yourself, harm others, any concerning change in behavior, severe depression, inappropriate drug use or any other new or concerning symptoms. ° °

## 2021-01-22 NOTE — ED Notes (Signed)
Hourly rounding completed at this time, patient currently asleep in room. No complaints, stable, and in no acute distress. Q15 minute rounds and monitoring via Security Cam[employer] to continue. 

## 2021-01-22 NOTE — ED Notes (Signed)
Pt asleep. Will obtain vitals once awake.  

## 2021-01-22 NOTE — ED Notes (Signed)
Returned call from [place], legal guardian. Gives pt permission to be discharged of her own accord. Aware pt was seen for mental health. Verbalized OK to d/c pt.

## 2021-01-22 NOTE — ED Notes (Signed)
Pt asleep at this time, unable to collect vitals. Will collect pt vitals once awake. 

## 2021-01-22 NOTE — ED Notes (Signed)
VOL unable to get ahold of legal guardian in order to discharge

## 2021-01-22 NOTE — ED Notes (Signed)
Pt states that she wants to leave. Pt adamant that mother is not in charge of her. Guardianship papers reviewed in chart. Charge RN aware.

## 2021-01-22 NOTE — ED Notes (Signed)
Attempted to call Legal Guardian again. No answer. Voicemail left.

## 2023-10-16 NOTE — L&D Delivery Note (Signed)
 Lindsay Perry  [id number]   Patient arrived, via EMS, reporting delivery of living female infant around 2215.  Patient LMP unknown.  EMT reports placenta delivered prior to arrival on scene.  Vaginal inspection reveals no lacerations.  Fundus firm and at U/-1.  NICU in nursery.  Harlene LITTIE Duncans MSN, CNM Advanced Practice Provider, Center for [employer]

## 2024-05-08 ENCOUNTER — Inpatient Hospital Stay (HOSPITAL_COMMUNITY)
Admission: AD | Admit: 2024-05-08 | Discharge: 2024-05-10 | DRG: 776 | Disposition: A | Discharge status: Home / Self Care | Payer: MEDICAID | Attending: Family Medicine | Admitting: Family Medicine

## 2024-05-08 DIAGNOSIS — F1410 Cocaine abuse, uncomplicated: Secondary | ICD-10-CM | POA: Diagnosis present

## 2024-05-08 DIAGNOSIS — O0930 Supervision of pregnancy with insufficient antenatal care, unspecified trimester: Secondary | ICD-10-CM

## 2024-05-08 DIAGNOSIS — F1423 Cocaine dependence with withdrawal: Secondary | ICD-10-CM | POA: Diagnosis present

## 2024-05-08 DIAGNOSIS — Z5900 Homelessness unspecified: Secondary | ICD-10-CM

## 2024-05-08 DIAGNOSIS — O99325 Drug use complicating the puerperium: Principal | ICD-10-CM | POA: Diagnosis present

## 2024-05-09 ENCOUNTER — Encounter (HOSPITAL_COMMUNITY): Payer: Self-pay

## 2024-05-09 DIAGNOSIS — F1423 Cocaine dependence with withdrawal: Secondary | ICD-10-CM | POA: Diagnosis present

## 2024-05-09 DIAGNOSIS — O99324 Drug use complicating childbirth: Secondary | ICD-10-CM | POA: Diagnosis not present

## 2024-05-09 DIAGNOSIS — O99325 Drug use complicating the puerperium: Secondary | ICD-10-CM | POA: Diagnosis present

## 2024-05-09 DIAGNOSIS — Z3A00 Weeks of gestation of pregnancy not specified: Secondary | ICD-10-CM | POA: Diagnosis not present

## 2024-05-09 DIAGNOSIS — O0933 Supervision of pregnancy with insufficient antenatal care, third trimester: Secondary | ICD-10-CM | POA: Diagnosis not present

## 2024-05-09 DIAGNOSIS — F1410 Cocaine abuse, uncomplicated: Secondary | ICD-10-CM | POA: Diagnosis not present

## 2024-05-09 DIAGNOSIS — F1420 Cocaine dependence, uncomplicated: Secondary | ICD-10-CM | POA: Diagnosis not present

## 2024-05-09 DIAGNOSIS — Z5900 Homelessness unspecified: Secondary | ICD-10-CM | POA: Diagnosis not present

## 2024-05-09 LAB — CBC
HCT: 32 % — ABNORMAL LOW (ref 36.0–46.0)
Hemoglobin: 10.9 g/dL — ABNORMAL LOW (ref 12.0–15.0)
MCH: 31.9 pg (ref 26.0–34.0)
MCHC: 34.1 g/dL (ref 30.0–36.0)
MCV: 93.6 fL (ref 80.0–100.0)
Platelets: 155 K/uL (ref 150–400)
RBC: 3.42 MIL/uL — ABNORMAL LOW (ref 3.87–5.11)
RDW: 13.2 % (ref 11.5–15.5)
WBC: 8 K/uL (ref 4.0–10.5)
nRBC: 0 % (ref 0.0–0.2)

## 2024-05-09 LAB — COMPREHENSIVE METABOLIC PANEL WITH GFR
ALT: 10 U/L (ref 0–44)
AST: 16 U/L (ref 15–41)
Albumin: 2.3 g/dL — ABNORMAL LOW (ref 3.5–5.0)
Alkaline Phosphatase: 123 U/L (ref 38–126)
Anion gap: 7 (ref 5–15)
BUN: 7 mg/dL (ref 6–20)
CO2: 19 mmol/L — ABNORMAL LOW (ref 22–32)
Calcium: 8.2 mg/dL — ABNORMAL LOW (ref 8.9–10.3)
Chloride: 106 mmol/L (ref 98–111)
Creatinine, Ser: 0.5 mg/dL (ref 0.44–1.00)
GFR, Estimated: >60 mL/min (ref >60)
Glucose, Bld: 107 mg/dL — ABNORMAL HIGH (ref 70–99)
Potassium: 3.4 mmol/L — ABNORMAL LOW (ref 3.5–5.1)
Sodium: 132 mmol/L — ABNORMAL LOW (ref 135–145)
Total Bilirubin: 0.4 mg/dL (ref 0.0–1.2)
Total Protein: 5.3 g/dL — ABNORMAL LOW (ref 6.5–8.1)

## 2024-05-09 LAB — HEPATITIS B SURFACE ANTIGEN: Hepatitis B Surface Ag: NONREACTIVE

## 2024-05-09 LAB — URINALYSIS, ROUTINE W REFLEX MICROSCOPIC
Bacteria, UA: NONE SEEN
Bilirubin Urine: NEGATIVE
Glucose, UA: NEGATIVE mg/dL
Ketones, ur: NEGATIVE mg/dL
Leukocytes,Ua: NEGATIVE
Nitrite: NEGATIVE
Protein, ur: 100 mg/dL — AB
RBC / HPF: >50 RBC/hpf (ref 0–5)
Specific Gravity, Urine: 1.026 (ref 1.005–1.030)
pH: 5 (ref 5.0–8.0)

## 2024-05-09 LAB — TYPE AND SCREEN
ABO/RH(D): A POS
Antibody Screen: NEGATIVE

## 2024-05-09 LAB — RAPID HIV SCREEN (HIV 1/2 AB+AG)
HIV 1/2 Antibodies: NONREACTIVE
HIV-1 P24 Antigen - HIV24: NONREACTIVE

## 2024-05-09 LAB — RAPID URINE DRUG SCREEN, HOSP PERFORMED
Amphetamines: NOT DETECTED
Barbiturates: NOT DETECTED
Benzodiazepines: NOT DETECTED
Cocaine: POSITIVE — AB
Opiates: NOT DETECTED
Tetrahydrocannabinol: NOT DETECTED

## 2024-05-09 LAB — RPR: RPR Ser Ql: NONREACTIVE

## 2024-05-09 MED ORDER — ONDANSETRON HCL 4 MG/2ML IJ SOLN: 4.0000 mg | INTRAMUSCULAR | Status: DC | PRN

## 2024-05-09 MED ORDER — PERMETHRIN 1 % EX LIQD
Freq: Once | CUTANEOUS | Status: AC
  Filled 2024-05-09: qty 59

## 2024-05-09 MED ORDER — IBUPROFEN 600 MG PO TABS
600.0000 mg | ORAL_TABLET | Freq: Four times a day (QID) | ORAL | Status: DC
  Administered 2024-05-09 (×2): 600 mg via ORAL
  Filled 2024-05-09 (×4): qty 1

## 2024-05-09 MED ORDER — LACTATED RINGERS IV SOLN: 500.0000 mL | INTRAVENOUS | Status: DC | PRN

## 2024-05-09 MED ORDER — ONDANSETRON HCL 4 MG PO TABS: 4.0000 mg | ORAL_TABLET | ORAL | Status: DC | PRN

## 2024-05-09 MED ORDER — BENZOCAINE-MENTHOL 20-0.5 % EX AERO: 1.0000 | INHALATION_SPRAY | CUTANEOUS | Status: DC | PRN

## 2024-05-09 MED ORDER — MEDROXYPROGESTERONE ACETATE 150 MG/ML IM SUSP: 150.0000 mg | Freq: Once | INTRAMUSCULAR | Status: DC

## 2024-05-09 MED ORDER — ZOLPIDEM TARTRATE 5 MG PO TABS: 5.0000 mg | ORAL_TABLET | Freq: Every evening | ORAL | Status: DC | PRN

## 2024-05-09 MED ORDER — OXYTOCIN BOLUS FROM INFUSION
333.0000 mL | Freq: Once | INTRAVENOUS | Status: AC
  Administered 2024-05-09: 333 mL via INTRAVENOUS

## 2024-05-09 MED ORDER — TETANUS-DIPHTH-ACELL PERTUSSIS 5-2.5-18.5 LF-MCG/0.5 IM SUSY: 0.5000 mL | PREFILLED_SYRINGE | Freq: Once | INTRAMUSCULAR | Status: DC

## 2024-05-09 MED ORDER — SIMETHICONE 80 MG PO CHEW: 80.0000 mg | CHEWABLE_TABLET | ORAL | Status: DC | PRN

## 2024-05-09 MED ORDER — SENNOSIDES-DOCUSATE SODIUM 8.6-50 MG PO TABS: 2.0000 | ORAL_TABLET | ORAL | Status: DC

## 2024-05-09 MED ORDER — ONDANSETRON HCL 4 MG/2ML IJ SOLN: 4.0000 mg | Freq: Four times a day (QID) | INTRAMUSCULAR | Status: DC | PRN

## 2024-05-09 MED ORDER — DIBUCAINE (PERIANAL) 1 % EX OINT: 1.0000 | TOPICAL_OINTMENT | CUTANEOUS | Status: DC | PRN

## 2024-05-09 MED ORDER — WITCH HAZEL-GLYCERIN EX PADS: 1.0000 | MEDICATED_PAD | CUTANEOUS | Status: DC | PRN

## 2024-05-09 MED ORDER — SOD CITRATE-CITRIC ACID 500-334 MG/5ML PO SOLN: 30.0000 mL | ORAL | Status: DC | PRN

## 2024-05-09 MED ORDER — PRENATAL MULTIVITAMIN CH
1.0000 | ORAL_TABLET | Freq: Every day | ORAL | Status: DC
  Administered 2024-05-09: 1 via ORAL
  Filled 2024-05-09: qty 1

## 2024-05-09 MED ORDER — LACTATED RINGERS IV SOLN: INTRAVENOUS | Status: DC

## 2024-05-09 MED ORDER — DIPHENHYDRAMINE HCL 25 MG PO CAPS: 25.0000 mg | ORAL_CAPSULE | Freq: Four times a day (QID) | ORAL | Status: DC | PRN

## 2024-05-09 MED ORDER — COCONUT OIL OIL: 1.0000 | TOPICAL_OIL | Status: DC | PRN

## 2024-05-09 MED ORDER — OXYTOCIN-SODIUM CHLORIDE 30-0.9 UT/500ML-% IV SOLN
2.5000 [IU]/h | INTRAVENOUS | Status: DC
  Filled 2024-05-09: qty 500

## 2024-05-09 MED ORDER — ACETAMINOPHEN 325 MG PO TABS: 650.0000 mg | ORAL_TABLET | ORAL | Status: DC | PRN

## 2024-05-09 MED ORDER — LIDOCAINE HCL (PF) 1 % IJ SOLN: 30.0000 mL | INTRAMUSCULAR | Status: DC | PRN

## 2024-05-09 NOTE — Discharge Instructions (Signed)
 [facility] Mental Health Resources What if I or someone I know is in crisis? If you are thinking about harming yourself or having thoughts of suicide, or if you know someone who is, seek help right away. Call your doctor or mental health care provider. Call 911 or go to a hospital emergency room to get immediate help, or ask a friend or family member to help you do these things; IF YOU ARE IN [place], YOU MAY GO TO WALK-IN URGENT CARE 24/7 at [facility] (see below) Call the [place] National Suicide Prevention Lifeline's toll-free, 24-hour hotline at 1-800-273-TALK 2692738357) or TTY: 1-800-799-4 TTY 365-755-5368) to talk to a trained counselor. If you are in crisis, make sure you are not left alone.  If someone else is in crisis, make sure he or she is not left alone 24 Hour Mental Health Services:  [facility]  [address] [place], [place] [address] [phone] or [phone] WALK-IN URGENT CARE 24/7 [facility] Mobile Crisis: [phone] [place] National Suicide Hotline: 330 035 5058 [facility] [employer] (Domestic Violence, Rape & Victim Assistance)  [phone] [facility] [employer] Crisis Services: [phone] (8am-4pm) or [phone] (after hours)

## 2024-05-09 NOTE — H&P (Signed)
 Lindsay Perry is a 33 y.o. female presenting for recovery after vaginal delivery.  Patient states she was unaware that she was pregnant.  She states she delivered around 2215 and the placenta delivered shortly after.  Patient reports this is her 4th delivery and that she had no issues with her previous pregnancies.  Patient denies recent drug or alcohol usage.    OB History   No obstetric history on file.    No past medical history on file. The histories are not reviewed yet. Please review them in the History navigator section and refresh this SmartLink. Family History: family history is not on file. Social History:  has no history on file for tobacco use, alcohol use, and drug use.     Maternal Diabetes: No Prenatal Care Genetic Screening: None Maternal Ultrasounds/Referrals:  Fetal Ultrasounds or other Referrals:  None Maternal Substance Abuse:  UDS in Process Significant Maternal Medications:  Denies Significant Maternal Lab Results:  Other:  Pending Number of Prenatal Visits:Less than or equal to 3 verified prenatal visits Maternal Vaccinations: None Other Comments:  None  Review of Systems  Constitutional:  Negative for chills and fever.  Gastrointestinal:  Positive for abdominal pain. Negative for nausea and vomiting.  Genitourinary:  Positive for vaginal bleeding.   Maternal Medical History:  Reason for admission: Nausea. Vaginal Delivery Outside of Hospital  Prenatal complications: No prenatal care     There were no vitals taken for this visit. Maternal Exam:  Introitus: Normal vulva. Vulva is negative for lesion.  Normal vagina.  No lacerations Cervix: not evaluated.   Physical Exam Vitals reviewed. Exam conducted with a chaperone present Gwynn, RN).  Constitutional:      Appearance: Normal appearance.  HENT:     Head: Normocephalic and atraumatic.  Eyes:     Conjunctiva/sclera: Conjunctivae normal.  Cardiovascular:     Rate and Rhythm: Normal rate.   Pulmonary:     Effort: Pulmonary effort is normal. No respiratory distress.  Abdominal:     Palpations: Abdomen is soft.     Tenderness: There is abdominal tenderness (At fundus, appropriate for recent delivery).  Genitourinary:    General: Normal vulva.  Vulva is no lesion.     Comments: Visual and limited sterile manual vaginal exam performed with no lacerations noted. Bleeding scant. Musculoskeletal:     Cervical back: Normal range of motion.  Skin:    General: Skin is warm and dry.  Neurological:     Mental Status: She is alert.  Psychiatric:        Mood and Affect: Mood and affect normal.        Speech: Speech normal.        Behavior: Behavior is cooperative.        Cognition and Memory: Cognition is impaired.     Prenatal labs: ABO, Rh:  Pending Antibody:  Pending Rubella:  Pending RPR:   Pending HBsAg:   Pending HIV:   Pending GBS:   Unknown  Assessment/Plan: 33 year old Female [id number] No Prenatal Care Unknown GA Outside of Hospital Delivery-Vaginal H/O Schizophrenia and BPD   -Admit to labor per protocol. -Provider to bedside to perform exam. -Patient stable and reports mild pain. -Discussed obtaining labs and giving pitocin . Patient agreeable.  -Labs including routine prenatal labs ordered.  UDS also ordered.    Lindsay LITTIE Duncans MSN, CNM Advanced Practice Provider, Center for [facility] 05/09/2024, 12:21 AM

## 2024-05-09 NOTE — Progress Notes (Signed)
 CSW received call from [facility] on-call CPS social worker, Elease Single. Per social worker Elim, CPS report was accepted as a 24 hour response. Social worker Banks to meet with [name] in the morning 08-19-24) at bedside.   Care team instructed to contact CPS social worker Single at [phone] if concern arises about [name] leaving hospital prior to CPS making contact with [name].   Barriers to infant's discharge remain in place.  Signed,  Sharyne LOIS Roulette, MSW, LCSWA, LCASA 05/25/24 6:06 PM
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
## 2024-05-09 NOTE — Progress Notes (Signed)
 Contacted by Social Work regarding this patient and concerns regarding mental status both in terms of cognitive ability and BH history. Patient is oriented x3 per social work, but also has periods in which she is less lucid.   Previous baby was [facility]. Reports she was unaware she was pregnant this time. Her mother, Tobias Cleverly is her guardian. She is not sure when she last spoke with her mother.   Psych Consult placed and confirmed with [name], NP that order is appropriate and they will see her 05/10/2024.   Poor resources for supporting herself or baby. SW reports she will have to file with CPS.  Please see Sharyne Roulette, LCSWA excellent noted dated 05/09/2024.  Camie Rote, MSN, CNM, RNC-OB Certified Nurse Midwife, [facility] 05/09/2024 5:32 PM

## 2024-05-09 NOTE — Progress Notes (Signed)
 Patient ID: Lindsay Perry, female   DOB: 1991/06/05, 33 y.o.   MRN: [id number]  Post Partum Day One:S/P VD  Subjective: Patient resting, denies syncope or dizziness. Reports consuming regular diet without issues and denies N/V. Denies issues with urination and reports bleeding is okay.    Desires depo for postpartum contraception.  Pain is being appropriately managed with use of ibuprofen .  Objective: Vitals:   05/09/24 0200  BP: (!) 101/57  Pulse: (!) 53  Resp: 18  Temp: 98.9 F (37.2 C)  TempSrc: Oral  SpO2: 100%   Recent Labs    05/09/24 0550  HGB 10.9*  HCT 32.0*    Physical Exam:  General: cooperative and no distress Mood/Affect: Appropriate/Appropriate Lungs: clear to auscultation, no wheezes, rales or rhonchi, symmetric air entry.  Heart: normal rate and regular rhythm. Breast: not examined. Abdomen:  + bowel sounds, Soft Uterine Fundus: firm, U/-2 Lochia: appropriate Laceration: None Skin: Warm, Dry DVT Evaluation: No significant calf/ankle edema.  Assessment S/P Vaginal Delivery-Day One Normal Involution  Plan: -Discussed continued rest. -SW consult needed. -Desires depo provera  -Plan for discharge tomorrow.  -L&D team to be updated on patient status.   Harlene LITTIE Duncans, MSN, CNM 05/09/2024, 7:23 AM

## 2024-05-09 NOTE — Lactation Note (Signed)
 This note was copied from a baby's chart. Lactation Consultation Note  Patient Name: Lindsay Perry Unijb'd Date: 05/09/2024 Age:35 hours   Mom is formula feeding and LC services complete   Consult Status Consult Status: Complete    Claudene Aleck BRAVO 05/09/2024, 1:42 PM
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
## 2024-05-10 DIAGNOSIS — O0930 Supervision of pregnancy with insufficient antenatal care, unspecified trimester: Secondary | ICD-10-CM

## 2024-05-10 DIAGNOSIS — F1410 Cocaine abuse, uncomplicated: Secondary | ICD-10-CM | POA: Diagnosis present

## 2024-05-10 DIAGNOSIS — F1420 Cocaine dependence, uncomplicated: Secondary | ICD-10-CM

## 2024-05-10 DIAGNOSIS — O0933 Supervision of pregnancy with insufficient antenatal care, third trimester: Secondary | ICD-10-CM | POA: Diagnosis not present

## 2024-05-10 DIAGNOSIS — O99324 Drug use complicating childbirth: Secondary | ICD-10-CM

## 2024-05-10 DIAGNOSIS — Z3A00 Weeks of gestation of pregnancy not specified: Secondary | ICD-10-CM

## 2024-05-10 DIAGNOSIS — Z5900 Homelessness unspecified: Secondary | ICD-10-CM

## 2024-05-10 LAB — URINE CULTURE

## 2024-05-10 MED ORDER — IBUPROFEN 600 MG PO TABS: 600.0000 mg | ORAL_TABLET | Freq: Four times a day (QID) | ORAL | Status: AC | PRN

## 2024-05-10 NOTE — Progress Notes (Signed)
 Patient is currently refusing fundal rubs and scheduled medications. Denies pain and denies assistance with anything. She requests to see her psychiatrist so that she can take her meds and get on up out of here. Is currently irritable and requesting for no one to enter her room. She just wants to sleep.

## 2024-05-10 NOTE — Progress Notes (Signed)
 CPS worker Elease Single came with one of her coworkers to speak with [name] around 2100. I entered the room as well, as I had already been in the room with the patient, getting mom's VS and asking her about pain. Patient covered her face with a blanket as CPS asked her about cocaine use (which patient repeatedly denies), plans for the baby, current housing and other questions. [name] stated that initially she wanted to see the baby but now, I guess not. She stated that she will be leaving this place by herself, that she does not know where she will go once she leaves and that she has never used cocaine. After being asked a few questions by Aniya, patient's behavior becomes irritable and verbally aggressive. She tells us  to leave her alone and get out of her room.  Spoke with Dr. Catheryn who was wondering about patient's interaction with the baby. Although a note was not written by DSS stating whether or not mom can have contact with the baby, pediatrician does not feel it is safe for [name] to be around baby at all, as patient has unstable psychological status.

## 2024-05-10 NOTE — Discharge Summary (Addendum)
     Postpartum Discharge Summary    Patient Name: Lindsay Perry DOB: 09/14/1991 MRN: [id number]  Date of admission: 05/08/2024 Delivery date:05/08/2024 Delivering provider:   Date of discharge: 05/10/2024  Admitting diagnosis: s/p SVD in the woods. No prenatal care. Homeless . +cocaine on UDS   Discharge diagnosis: Delivered                                              Post partum procedures:none Augmentation: N/A Complications: none identified  Hospital course: Patient came in via EMS after having delivered by herself in the woods; EMS reports placenta delivered prior to their arrival. Baby weighed 2380gm She had an uncomplicated PP course. She declined SW interventions and asked to be discharged to home before being fully evaluated by SW and other services. Since she was doing fine medically and had no issues that required inpatient hospitalization, she was discharged to her own recognizance's. Patient also seen by psych and no acute issues. She declined PP Depo provera .   A POS/rubella pending/rpr neg/hiv neg/hepB surface antigen negative/ucx pending/CBC wnl/cmp wnl  UDS was positive for cocaine  Magnesium Sulfate received: No BMZ received: No Rhophylac:N/A MMR:No T-DaP:offered to patient prior to discharged Flu: N/A RSV Vaccine received: No Transfusion:No Immunizations administered: There is no immunization history for the selected administration types on file for this patient.  Physical exam  Vitals:   05/09/24 0330 05/09/24 1038 05/09/24 1451 05/09/24 2059  BP: 103/63 90/65 (!) 107/56 (!) 91/58  Pulse: 61 (!) 58 (!) 53 (!) 52  Resp: 18 16 16 18   Temp: 98.5 F (36.9 C) 99.7 F (37.6 C) 98.9 F (37.2 C) (!) 97.4 F (36.3 C)  TempSrc: Oral Oral Oral Axillary  SpO2: 99% 97% 99% 99%   General: alert Lochia: appropriate Uterine Fundus: firm, nttp Incision: N/A  Edinburgh Score:    05/09/2024    2:00 AM  Edinburgh Postnatal Depression Scale Screening Tool  I have  been able to laugh and see the funny side of things. --      After visit meds:  Allergies as of 05/10/2024   Not on File      Medication List     TAKE these medications    ibuprofen  600 MG tablet Commonly known as: ADVIL  Take 1 tablet (600 mg total) by mouth every 6 (six) hours as needed.         Discharge home in stable condition Infant Feeding: formula Infant Disposition:NICU Discharge instruction: per After Visit Summary and Postpartum booklet. Activity: Advance as tolerated. Pelvic rest for 6 weeks.  Diet: routine diet Anticipated Birth Control: unknown Future Appointments:No future appointments. Follow up Visit:  Follow-up Information     [facility]. Schedule an appointment as soon as possible for a visit.   Specialty: Obstetrics and Gynecology Why: routine after baby visit Contact information: [address] [place]  [address] [phone]                 05/10/2024 Bebe Furry, MD
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
## 2024-05-10 NOTE — Progress Notes (Signed)
 CSW placed call to [facility] After Hours CPS social worker, Elease Single [phone]) to inquire about an update on infant. CSW provided update to social worker Single that [name] has requested to leave the hospital. Per social worker Single, CPS met with [name] at bedside last night, 05/09/24 and [name] kicked everyone out of the room. A safety plan was not able to be put in place due not [name] refusal to speak with CPS. CPS is to attempt to make in person contact with [name]'s legal guardian, Tobias Cleverly [phone]) today after unsuccessful attempts reaching her via phone.   CSW made a second attempt to contact [name]'s legal guardian, Tobias Cleverly [phone]). No answer. HIPAA compliant voicemail left requesting return call.  Per social worker [place], hospital staff is instructed to call CPS immediately in the case [name] tries to leave with infant. CPS social worker Single can be reached at: [phone] or a call can be placed to [facility] After Hours CPS hotline: [phone].   Care team provided update.  Barriers to infant's discharge.   Signed,  Sharyne LOIS Roulette, MSW, LCSWA, LCASA 05/10/2024 8:43 AM

## 2024-05-11 ENCOUNTER — Encounter (HOSPITAL_COMMUNITY): Payer: Self-pay

## 2024-05-11 LAB — RUBELLA SCREEN: Rubella: 2.1 {index} (ref >0.99)

## 2024-05-26 ENCOUNTER — Telehealth (HOSPITAL_COMMUNITY): Payer: Self-pay | Admitting: *Deleted

## 2024-05-26 NOTE — Telephone Encounter (Signed)
 05/26/2024  Name: Lindsay Perry MRN: [id number] DOB: 1991/09/10  Reason for Call:  Transition of Care Hospital Discharge Call  Contact Status: Patient Contact Status: Message  Language assistant needed:          Follow-Up Questions:    Van Postnatal Depression Scale:  In the Past 7 Days:    PHQ2-9 Depression Scale:     Discharge Follow-up:    Post-discharge interventions: NA  Steva Banter, RN 05/26/2024 [id number]
